# Patient Record
Sex: Male | Born: 2000 | Race: White | Hispanic: No | Marital: Single
Health system: Southern US, Community
[De-identification: ages and names within clinical notes are randomized; demographics above are authoritative.]

## PROBLEM LIST (undated history)

## (undated) DIAGNOSIS — G40802 Other epilepsy, not intractable, without status epilepticus: Secondary | ICD-10-CM

## (undated) DIAGNOSIS — E669 Obesity, unspecified: Secondary | ICD-10-CM

## (undated) DIAGNOSIS — H539 Unspecified visual disturbance: Secondary | ICD-10-CM

## (undated) DIAGNOSIS — G939 Disorder of brain, unspecified: Secondary | ICD-10-CM

## (undated) DIAGNOSIS — L988 Other specified disorders of the skin and subcutaneous tissue: Secondary | ICD-10-CM

---
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
## 2019-04-29 ENCOUNTER — Emergency Department (HOSPITAL_COMMUNITY)
Admission: EM | Admit: 2019-04-29 | Discharge: 2019-04-30 | Disposition: A | Discharge status: Home / Self Care | Payer: 59 | Attending: Emergency Medicine | Admitting: Emergency Medicine

## 2019-04-29 ENCOUNTER — Other Ambulatory Visit: Payer: Self-pay

## 2019-04-29 ENCOUNTER — Encounter (HOSPITAL_COMMUNITY): Payer: Self-pay

## 2019-04-29 DIAGNOSIS — Z7722 Contact with and (suspected) exposure to environmental tobacco smoke (acute) (chronic): Secondary | ICD-10-CM | POA: Diagnosis not present

## 2019-04-29 DIAGNOSIS — R569 Unspecified convulsions: Secondary | ICD-10-CM | POA: Insufficient documentation

## 2019-04-29 LAB — CBG MONITORING, ED: Glucose-Capillary: 108 mg/dL — ABNORMAL HIGH (ref 70–99)

## 2019-04-29 MED ORDER — LACTATED RINGERS IV BOLUS
1000.0000 mL | Freq: Once | INTRAVENOUS | Status: AC
  Administered 2019-04-29: 1000 mL via INTRAVENOUS

## 2019-04-29 MED ORDER — VALPROATE SODIUM 500 MG/5ML IV SOLN
500.0000 mg | Freq: Once | INTRAVENOUS | Status: AC
  Administered 2019-04-30: 01:00:00 500 mg via INTRAVENOUS
  Filled 2019-04-29: qty 5

## 2019-04-29 MED ORDER — LORAZEPAM 2 MG/ML IJ SOLN
2.0000 mg | INTRAMUSCULAR | Status: DC | PRN
  Administered 2019-04-29: 2 mg via INTRAVENOUS
  Filled 2019-04-29: qty 1

## 2019-04-29 NOTE — ED Triage Notes (Signed)
Pt brought in by the pt father. Father reports that the pt has been having seizures since 3pm. Father reports having at least 15 seizures today. Pt has a hxt of seizures.   Pt arrived aox4 and then had a seizure. Seizure lasted 20-25sec.

## 2019-04-29 NOTE — ED Provider Notes (Signed)
Emergency Department Provider Note   I have reviewed the triage vital signs and the nursing notes.   HISTORY  Chief Complaint Seizures   HPI Joe Thomas is a 19 y.o. male who presents the emerge department today with seizures.  Limited history secondary to father not being around but on review of the records and talking the patient is sounds like he has had more than 5 seizures today.  He is recently switched to a new medication called X CO PRI, has been titrating his other medications and that 1 accordingly.  Notes from [facility] neurology directing the same.  Patient denies any injuries or pain at this time.  He is very sleepy but denies any other changes in diet or health that could be contributing.  Father is not in the room at this time will obtain more history when he comes back. Father states that they did not actually reduce any dosing they just switched the lamotrigine to the morning because he was oversedated at night.  They increased his Depakote but that was just today that they recommended that.  He states compliance with medications and approximately 13-15 seizures throughout the day today as far as he can count.  Had already given him some intranasal benzodiazepine.  He states his seizures are typical for him with clenching of his arms and loss of consciousness lasting approximately 15 to 20 seconds.   No other associated or modifying symptoms.    Past Medical History:  Diagnosis Date  . Brain damage   . Frontal lobe epilepsy (HCC)    seizures are always at night, per father; last seizure 01/23/2017  . Obesity   . Pilonidal disease 01/2017  . Vision abnormalities    " eyes jump around "    Patient Active Problem List   Diagnosis Date Noted  . Pilonidal disease 01/29/2017  . 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 ADENOIDECTOMY  2003  . IMPLANTATION VAGAL NERVE STIMULATOR  08/20/2016  . PILONIDAL CYST EXCISION N/A  01/30/2017   Procedure: EXCISION PILONIDAL CYST WITH OPEN PACKING;  Surgeon: Darnell Level, MD;  Location: [facility] OR;  Service: General;  Laterality: N/A;  . TONSILLECTOMY  2003  . TYMPANOSTOMY TUBE PLACEMENT  2003    Current Outpatient Rx  . Order #: [id number] Class: Historical Med  . Order #: [id number] Class: Historical Med  . Order #: [id number] Class: Historical Med  . Order #: [id number] Class: Historical Med  . Order #: [id number] Class: Historical Med    Allergies Keppra [levetiracetam] and Penicillins  Family History  Problem Relation Age of Onset  . Multiple sclerosis Mother   . Diabetes Father   . COPD Maternal Grandmother   . Cancer Maternal Grandfather   . Stroke Paternal Grandmother   . Diabetes Paternal Grandfather   . Cancer Maternal Aunt     Social History Social History   Tobacco Use  . Smoking status: Passive Smoke Exposure - Never Smoker  . Smokeless tobacco: Never Used  . Tobacco comment: mother smokes inside  Substance Use Topics  . Alcohol use: No  . Drug use: No    Review of Systems  All other systems negative except as documented in the HPI. All pertinent positives and negatives as reviewed in the HPI. ____________________________________________   PHYSICAL EXAM:  VITAL SIGNS: ED Triage Vitals [04/29/19 2321]  Enc Vitals Group     BP 138/86     Pulse Rate (!) 120  Resp 20     Temp 99.6 F (37.6 C)     Temp Source Oral     SpO2 100 %    Constitutional: Alert and oriented. Well appearing and in no acute distress. Eyes: Conjunctivae are normal. PERRL. Bilateral horizontal nystagmus but EOMI otherwise. Head: Atraumatic. Nose: No congestion/rhinnorhea. Mouth/Throat: Mucous membranes are moist.  Oropharynx non-erythematous. Neck: No stridor.  No meningeal signs.   Cardiovascular: tachycardic rate, regular rhythm. Good peripheral circulation. Grossly normal heart sounds.   Respiratory: Normal respiratory effort.  No retractions. Lungs  CTAB. Gastrointestinal: Soft and nontender. No distention.  Musculoskeletal: No lower extremity tenderness nor edema. No gross deformities of extremities. Neurologic:  Normal speech and language. No gross focal neurologic deficits are appreciated.  Skin:  Skin is warm, dry and intact. No rash noted.   ____________________________________________   LABS (all labs ordered are listed, but only abnormal results are displayed)  Labs Reviewed  VALPROIC ACID LEVEL - Abnormal; Notable for the following components:      Result Value   Valproic Acid Lvl 29 (*)    All other components within normal limits  COMPREHENSIVE METABOLIC PANEL - Abnormal; Notable for the following components:   Glucose, Bld 109 (*)    Total Protein 9.0 (*)    All other components within normal limits  CBG MONITORING, ED - Abnormal; Notable for the following components:   Glucose-Capillary 108 (*)    All other components within normal limits  CBC WITH DIFFERENTIAL/PLATELET  LAMOTRIGINE LEVEL   ____________________________________________  EKG   EKG Interpretation  Date/Time:  Wednesday April 29 2019 23:19:46 EDT Ventricular Rate:  114 PR Interval:    QRS Duration: 89 QT Interval:  296 QTC Calculation: 408 R Axis:   87 Text Interpretation: Sinus tachycardia Abnormal Q suggests anterior infarct Repol abnrm suggests ischemia, inferior leads Borderline ST elevation, anterior leads mild inferior st changes compared to previous Confirmed by Merrily Pew [phone]) on 04/29/2019 11:29:56 PM       ____________________________________________  RADIOLOGY  No results found.  ____________________________________________   PROCEDURES  Procedure(s) performed:   Procedures   ____________________________________________   INITIAL IMPRESSION / ASSESSMENT AND PLAN / ED COURSE  Patient with multiple seizures today.  Will check lamotrigine and valproic acid levels.  Will load with Depakote, as needed Ativan  for repeat seizures, evaluate for any significant abnormalities on blood.  Will need to discuss with his neurologist at [facility] to see if they want to admit him. Discussed with Dr. Sabino Niemann at [facility] who is on-call for Dr. Brayton Caves recommends little bit longer observation in the ER and if still seizure-free and family comfortable then discharged home with close outpatient follow-up.  Depakote level low so family will observe him take his medications the next week.  No more seizures in the emergency department after Depakote load and benzodiazepines.  At this time patient will be discharged to call Dr. Brayton Caves for follow up. Recommendations discussed with father at bedside.   Pertinent labs & imaging results that were available during my care of the patient were reviewed by me and considered in my medical decision making (see chart for details).  A medical screening exam was performed and I feel the patient has had an appropriate workup for their chief complaint at this time and likelihood of emergent condition existing is low. They have been counseled on decision, discharge, follow up and which symptoms necessitate immediate return to the emergency department. They or their family verbally stated understanding and agreement  with plan and discharged in stable condition.   ____________________________________________  FINAL CLINICAL IMPRESSION(S) / ED DIAGNOSES  Final diagnoses:  Seizures (HCC)     MEDICATIONS GIVEN DURING THIS VISIT:  Medications  LORazepam (ATIVAN) injection 2 mg (2 mg Intravenous Given 04/29/19 2327)  lactated ringers bolus 1,000 mL (0 mLs Intravenous Stopped 04/30/19 0052)  valproate (DEPACON) 500 mg in dextrose 5 % 50 mL IVPB (0 mg Intravenous Stopped 04/30/19 0152)     NEW OUTPATIENT MEDICATIONS STARTED DURING THIS VISIT:  Current Discharge Medication List      Note:  This note was prepared with assistance of Dragon voice recognition software. Occasional wrong-word or  sound-a-like substitutions may have occurred due to the inherent limitations of voice recognition software.   Dmonte Maher, Barbara Cower, MD 04/30/19 [phone]

## 2019-04-30 DIAGNOSIS — R569 Unspecified convulsions: Secondary | ICD-10-CM | POA: Diagnosis not present

## 2019-04-30 LAB — VALPROIC ACID LEVEL: Valproic Acid Lvl: 29 ug/mL — ABNORMAL LOW (ref 50.0–100.0)

## 2019-04-30 LAB — COMPREHENSIVE METABOLIC PANEL
ALT: 32 U/L (ref 0–44)
AST: 24 U/L (ref 15–41)
Albumin: 4.5 g/dL (ref 3.5–5.0)
Alkaline Phosphatase: 58 U/L (ref 38–126)
Anion gap: 15 (ref 5–15)
BUN: 12 mg/dL (ref 6–20)
CO2: 25 mmol/L (ref 22–32)
Calcium: 10 mg/dL (ref 8.9–10.3)
Chloride: 102 mmol/L (ref 98–111)
Creatinine, Ser: 0.67 mg/dL (ref 0.61–1.24)
GFR calc Af Amer: >60 mL/min (ref >60)
GFR calc non Af Amer: >60 mL/min (ref >60)
Glucose, Bld: 109 mg/dL — ABNORMAL HIGH (ref 70–99)
Potassium: 4 mmol/L (ref 3.5–5.1)
Sodium: 142 mmol/L (ref 135–145)
Total Bilirubin: 0.3 mg/dL (ref 0.3–1.2)
Total Protein: 9 g/dL — ABNORMAL HIGH (ref 6.5–8.1)

## 2019-04-30 LAB — CBC WITH DIFFERENTIAL/PLATELET
Abs Immature Granulocytes: 0.02 10*3/uL (ref 0.00–0.07)
Basophils Absolute: 0 10*3/uL (ref 0.0–0.1)
Basophils Relative: 1 %
Eosinophils Absolute: 0.1 10*3/uL (ref 0.0–0.5)
Eosinophils Relative: 1 %
HCT: 46.9 % (ref 39.0–52.0)
Hemoglobin: 16 g/dL (ref 13.0–17.0)
Immature Granulocytes: 0 %
Lymphocytes Relative: 29 %
Lymphs Abs: 2.2 10*3/uL (ref 0.7–4.0)
MCH: 29 pg (ref 26.0–34.0)
MCHC: 34.1 g/dL (ref 30.0–36.0)
MCV: 85.1 fL (ref 80.0–100.0)
Monocytes Absolute: 0.9 10*3/uL (ref 0.1–1.0)
Monocytes Relative: 11 %
Neutro Abs: 4.5 10*3/uL (ref 1.7–7.7)
Neutrophils Relative %: 58 %
Platelets: 336 10*3/uL (ref 150–400)
RBC: 5.51 MIL/uL (ref 4.22–5.81)
RDW: 12.9 % (ref 11.5–15.5)
WBC: 7.7 10*3/uL (ref 4.0–10.5)
nRBC: 0 % (ref 0.0–0.2)

## 2019-04-30 NOTE — Discharge Instructions (Signed)
Please try to watch and ensure appropriate medication dosages until follow up with neurologist.

## 2019-04-30 NOTE — ED Notes (Signed)
Patient verbalizes understanding of discharge instructions. Opportunity for questioning and answers were provided. Armband removed by staff, pt discharged from ED via wheelchair.  

## 2019-05-01 LAB — LAMOTRIGINE LEVEL: Lamotrigine Lvl: 4.3 ug/mL (ref 2.0–20.0)

## 2019-08-31 ENCOUNTER — Other Ambulatory Visit: Payer: Self-pay

## 2019-08-31 ENCOUNTER — Encounter (HOSPITAL_COMMUNITY): Payer: Self-pay | Admitting: Emergency Medicine

## 2019-08-31 ENCOUNTER — Emergency Department (HOSPITAL_COMMUNITY)
Admission: EM | Admit: 2019-08-31 | Discharge: 2019-08-31 | Disposition: A | Discharge status: Home / Self Care | Payer: 59 | Attending: Emergency Medicine | Admitting: Emergency Medicine

## 2019-08-31 DIAGNOSIS — Z7722 Contact with and (suspected) exposure to environmental tobacco smoke (acute) (chronic): Secondary | ICD-10-CM | POA: Insufficient documentation

## 2019-08-31 DIAGNOSIS — R569 Unspecified convulsions: Secondary | ICD-10-CM | POA: Insufficient documentation

## 2019-08-31 LAB — CBC
HCT: 47.3 % (ref 39.0–52.0)
Hemoglobin: 15.6 g/dL (ref 13.0–17.0)
MCH: 28.5 pg (ref 26.0–34.0)
MCHC: 33 g/dL (ref 30.0–36.0)
MCV: 86.3 fL (ref 80.0–100.0)
Platelets: 344 10*3/uL (ref 150–400)
RBC: 5.48 MIL/uL (ref 4.22–5.81)
RDW: 12.5 % (ref 11.5–15.5)
WBC: 6.3 10*3/uL (ref 4.0–10.5)
nRBC: 0 % (ref 0.0–0.2)

## 2019-08-31 LAB — BASIC METABOLIC PANEL
Anion gap: 12 (ref 5–15)
BUN: 8 mg/dL (ref 6–20)
CO2: 26 mmol/L (ref 22–32)
Calcium: 9.1 mg/dL (ref 8.9–10.3)
Chloride: 106 mmol/L (ref 98–111)
Creatinine, Ser: 0.38 mg/dL — ABNORMAL LOW (ref 0.61–1.24)
GFR calc Af Amer: >60 mL/min (ref >60)
GFR calc non Af Amer: >60 mL/min (ref >60)
Glucose, Bld: 106 mg/dL — ABNORMAL HIGH (ref 70–99)
Potassium: 3.7 mmol/L (ref 3.5–5.1)
Sodium: 144 mmol/L (ref 135–145)

## 2019-08-31 LAB — VALPROIC ACID LEVEL: Valproic Acid Lvl: 65 ug/mL (ref 50.0–100.0)

## 2019-08-31 MED ORDER — LORAZEPAM 2 MG/ML IJ SOLN
2.0000 mg | Freq: Once | INTRAMUSCULAR | Status: AC
  Administered 2019-08-31: 2 mg via INTRAMUSCULAR
  Filled 2019-08-31: qty 1

## 2019-08-31 MED ORDER — LORAZEPAM 2 MG/ML IJ SOLN
2.0000 mg | Freq: Once | INTRAMUSCULAR | Status: AC
  Administered 2019-08-31: 2 mg via INTRAVENOUS
  Filled 2019-08-31: qty 1

## 2019-08-31 MED ORDER — SODIUM CHLORIDE 0.9 % IV BOLUS
1000.0000 mL | Freq: Once | INTRAVENOUS | Status: AC
  Administered 2019-08-31: 1000 mL via INTRAVENOUS

## 2019-08-31 NOTE — ED Notes (Signed)
meds given, will obs pt for shot time.

## 2019-08-31 NOTE — ED Provider Notes (Addendum)
[facility]-EMERGENCY DEPT Provider Note   CSN: [id number] Arrival date & time: 08/31/19  2010     History Chief Complaint  Patient presents with   Seizures    Joe Thomas is a 19 y.o. male.   Seizures Seizure activity on arrival: yes   Preceding symptoms: aura   Episode characteristics: abnormal movements, eye deviation, partial responsiveness and stiffening   Postictal symptoms: somnolence   Postictal symptoms: no confusion and no memory loss   Return to baseline: yes   Severity:  Moderate Timing:  Clustered Number of seizures this episode:  Multiple per day Progression:  Worsening Context comment:  Grandmother died and stress is a trigger Recent head injury:  No recent head injuries PTA treatment:  None History of seizures: yes        Past Medical History:  Diagnosis Date   Brain damage    Frontal lobe epilepsy (HCC)    seizures are always at night, per father; last seizure 01/23/2017   Obesity    Pilonidal disease 01/2017   Vision abnormalities    " eyes jump around "    Patient Active Problem List   Diagnosis Date Noted   Pilonidal disease 01/29/2017   Epilepsy (HCC) 11/15/2013    Past Surgical History:  Procedure Laterality Date   ADENOIDECTOMY  2003   IMPLANTATION VAGAL NERVE STIMULATOR  08/20/2016   PILONIDAL CYST EXCISION N/A 01/30/2017   Procedure: EXCISION PILONIDAL CYST WITH OPEN PACKING;  Surgeon: Darnell Level, MD;  Location: [facility] OR;  Service: General;  Laterality: N/A;   TONSILLECTOMY  2003   TYMPANOSTOMY TUBE PLACEMENT  2003       Family History  Problem Relation Age of Onset   Multiple sclerosis Mother    Diabetes Father    COPD Maternal Grandmother    Cancer Maternal Grandfather    Stroke Paternal Grandmother    Diabetes Paternal Grandfather    Cancer Maternal Aunt     Social History   Tobacco Use   Smoking status: Passive Smoke Exposure - Never Smoker   Smokeless tobacco:  Never Used   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 Authorizing Provider  Cenobamate (XCOPRI) 14 x 50 MG & 14 x100 MG TBPK Take 100 mg by mouth at bedtime.    [provider]  clonazePAM (KLONOPIN) 1 MG tablet Take 2 mg by mouth daily as needed (multiple seizures).    [provider]  divalproex (DEPAKOTE ER) 500 MG 24 hr tablet Take 2,000 mg by mouth at bedtime.    [provider]  lacosamide (VIMPAT) 200 MG TABS tablet Take 500 mg by mouth at bedtime.     [provider]  lamoTRIgine (LAMICTAL) 200 MG tablet Take 400 mg by mouth daily.     [provider]    Allergies    Keppra [levetiracetam] and Penicillins  Review of Systems   Review of Systems  Constitutional: Negative for chills and fever.  HENT: Negative for congestion and rhinorrhea.   Respiratory: Negative for cough and shortness of breath.   Cardiovascular: Negative for chest pain and palpitations.  Gastrointestinal: Negative for diarrhea, nausea and vomiting.  Genitourinary: Negative for difficulty urinating and dysuria.  Musculoskeletal: Negative for arthralgias and back pain.  Skin:  Negative for color change and rash.  Neurological: Positive for seizures. Negative for light-headedness and headaches.    Physical Exam Updated Vital Signs BP (!) 141/92 (BP Location: Left Arm)    Pulse 88    Temp 98.2 F (36.8 C) (Oral)    Resp 16    Ht 6\' 2"  (1.88 m)    Wt 122 kg    SpO2 100%    BMI 34.53 kg/m   Physical Exam Vitals and nursing note reviewed. Exam conducted with a chaperone present.  Constitutional:      General: He is not in acute distress.    Appearance: Normal appearance.  HENT:     Head: Normocephalic and atraumatic.     Nose: No rhinorrhea.  Eyes:     General:         Right eye: No discharge.        Left eye: No discharge.     Conjunctiva/sclera: Conjunctivae normal.  Cardiovascular:     Rate and Rhythm: Normal rate and regular rhythm.  Pulmonary:     Effort: Pulmonary effort is normal.     Breath sounds: No stridor.  Abdominal:     General: Abdomen is flat. There is no distension.     Palpations: Abdomen is soft.  Musculoskeletal:        General: No deformity or signs of injury.  Skin:    General: Skin is warm and dry.  Neurological:     General: No focal deficit present.     Mental Status: He is alert. Mental status is at baseline.     Motor: No weakness.     Comments: 5 out of 5 motor strength in all extremities, sensation intact throughout, no dysmetria, no dysdiadochokinesia, cranial nerves II through XII intact, alert and oriented to person place and time, did not ambulate patient due to frequency of seizures   Psychiatric:        Mood and Affect: Mood normal.        Behavior: Behavior normal.        Thought Content: Thought content normal.     ED Results / Procedures / Treatments   Labs (all labs ordered are listed, but only abnormal results are displayed) Labs Reviewed  BASIC METABOLIC PANEL - Abnormal; Notable for the following components:      Result Value   Glucose, Bld 106 (*)    Creatinine, Ser 0.38 (*)    All other components within normal limits  VALPROIC ACID LEVEL  CBC  LAMOTRIGINE LEVEL    EKG EKG Interpretation  Date/Time:  Monday August 31 2019 20:20:44 EDT Ventricular Rate:  105 PR Interval:    QRS Duration: 89 QT Interval:  312 QTC Calculation: 413 R Axis:   60 Text Interpretation: Sinus tachycardia Probable left atrial enlargement Borderline T abnormalities, inferior leads 12 Lead; Mason-Likar Confirmed by 10-15-2003 (Cherlynn Perches) on 08/31/2019 9:06:08 PM   Radiology No results found.  Procedures Procedures (including critical care time)  Medications Ordered in ED Medications  LORazepam (ATIVAN)  injection 2 mg (has no administration in time range)  sodium chloride 0.9 % bolus 1,000 mL (1,000 mLs Intravenous New Bag/Given 08/31/19 2040)  LORazepam (ATIVAN) injection 2 mg (2 mg Intravenous Given 08/31/19 2043)    ED Course  I have reviewed the triage vital signs and the nursing notes.  Pertinent labs & imaging results that were available during my care of the patient were reviewed by me and considered in my medical decision making (  see chart for details).    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  No recent illness or infection.  Vital signs stable no fevers.  Will get Ativan as I have witnessed 2 seizures that are very brief, no vital sign abnormalities.  The patient can feel them coming and let us know.  We will check Depakote lamotrigine levels basic labs and contact his pediatric neurology group [facility] peds neuro).  I reviewed the laboratory analyses, he was given a milligram of Ativan, labs are without significant findings, Depakote level is within normal limits.  Lamotrigine level is pending however when I spoke to the pediatric neurologist on call for his group they said it would likely be low as they are tapering it off.  They recommend 1 mg of Klonopin tonight 1 mg Klonopin tomorrow morning and to call their epileptologist for outpatient recommendations.  Ativan had to be given an initial onset, and the patient was here for almost 2 hours and required a second dose, however they feel comfortable with the second dose going home and starting the Klonopin and following up as needed.  He has no issues with hypoxia, his seizures are very short-lived, and he has his vagal nerve stimulator at home.  They will follow up tomorrow as recommended.  Strict return precautions given.  Final Clinical Impression(s) / ED  Diagnoses Final diagnoses:  Seizure [facility])    Rx / DC Orders ED Discharge Orders    None       Sabino Donovan, MD 08/31/19 2151    Sabino Donovan, MD 08/31/19 2210

## 2019-08-31 NOTE — ED Notes (Signed)
Pt states he feels as though he is getting and aura like he is going to have a seizure, reports tongue tingling. MD notified

## 2019-08-31 NOTE — Discharge Instructions (Addendum)
Take 1mg  klonipin tonight and 1mg  in the am and call your neurologist for further med recommendations

## 2019-08-31 NOTE — ED Triage Notes (Signed)
Patient reports increase in seizures since earlier today. States seizures are about 30 minutes apart and last approx 5-8 seconds. Patient had seizure in lobby which lasted approx 5-6 seconds. Patient states he does not know if he hit his head. AOx4.

## 2019-09-02 LAB — LAMOTRIGINE LEVEL: Lamotrigine Lvl: 1.2 ug/mL — ABNORMAL LOW (ref 2.0–20.0)

## 2020-01-16 ENCOUNTER — Other Ambulatory Visit: Payer: Self-pay

## 2020-01-16 ENCOUNTER — Emergency Department (HOSPITAL_COMMUNITY)
Admission: EM | Admit: 2020-01-16 | Discharge: 2020-01-17 | Disposition: A | Discharge status: Home / Self Care | Payer: 59 | Attending: Emergency Medicine | Admitting: Emergency Medicine

## 2020-01-16 ENCOUNTER — Emergency Department (HOSPITAL_COMMUNITY): Payer: 59

## 2020-01-16 DIAGNOSIS — Y92009 Unspecified place in unspecified non-institutional (private) residence as the place of occurrence of the external cause: Secondary | ICD-10-CM | POA: Diagnosis not present

## 2020-01-16 DIAGNOSIS — W01198A Fall on same level from slipping, tripping and stumbling with subsequent striking against other object, initial encounter: Secondary | ICD-10-CM | POA: Insufficient documentation

## 2020-01-16 DIAGNOSIS — Z23 Encounter for immunization: Secondary | ICD-10-CM | POA: Diagnosis not present

## 2020-01-16 DIAGNOSIS — Z7722 Contact with and (suspected) exposure to environmental tobacco smoke (acute) (chronic): Secondary | ICD-10-CM | POA: Diagnosis not present

## 2020-01-16 DIAGNOSIS — R569 Unspecified convulsions: Secondary | ICD-10-CM | POA: Diagnosis not present

## 2020-01-16 DIAGNOSIS — T148XXA Other injury of unspecified body region, initial encounter: Secondary | ICD-10-CM

## 2020-01-16 DIAGNOSIS — S0081XA Abrasion of other part of head, initial encounter: Secondary | ICD-10-CM | POA: Insufficient documentation

## 2020-01-16 LAB — CBG MONITORING, ED: Glucose-Capillary: 68 mg/dL — ABNORMAL LOW (ref 70–99)

## 2020-01-16 MED ORDER — TETANUS-DIPHTH-ACELL PERTUSSIS 5-2.5-18.5 LF-MCG/0.5 IM SUSY
0.5000 mL | PREFILLED_SYRINGE | Freq: Once | INTRAMUSCULAR | Status: AC
  Administered 2020-01-16: 0.5 mL via INTRAMUSCULAR
  Filled 2020-01-16: qty 0.5

## 2020-01-16 NOTE — ED Provider Notes (Signed)
[facility]-EMERGENCY DEPT Provider Note   CSN: [id number] Arrival date & time: 01/16/20  1749     History Chief Complaint  Patient presents with  . Seizures  . Head Laceration    Joe Thomas is a 19 y.o. male who presents for evaluation of head injury and seizure. Patient with known seizure disorders, currently followed by [facility]. He recently had a deep brain stimulator inserted. He reports that today he had a seizure, causing him to fall and hit his head. He thinks he hit it on the wall and then on the floor.  He does not think he had any LOC.  He does report that he is on his medications and states he has been compliant with it.  He does have seizures daily.  He states he has noted that he has had about 4-5 today.  He usually gets about 3 a day.  He is supposed to follow-up with his neurologist in 2 days.  He has not had any new medications or changes in his dosage.  He denies any recent sickness, fevers.  He denies any vision changes, numbness/weakness of arms or legs.  The history is provided by the patient.       Past Medical History:  Diagnosis Date  . Brain damage   . Frontal lobe epilepsy (HCC)    seizures are always at night, per father; last seizure 01/23/2017  . Obesity   . Pilonidal disease 01/2017  . Vision abnormalities    " eyes jump around "    Patient Active Problem List   Diagnosis Date Noted  . Pilonidal disease 01/29/2017  . 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 ADENOIDECTOMY  2003  . IMPLANTATION VAGAL NERVE STIMULATOR  08/20/2016  . PILONIDAL CYST EXCISION N/A 01/30/2017   Procedure: EXCISION PILONIDAL CYST WITH OPEN PACKING;  Surgeon: Darnell Level, MD;  Location: [facility] OR;  Service: General;  Laterality: N/A;  . TONSILLECTOMY  2003  . TYMPANOSTOMY TUBE PLACEMENT  2003       Family History  Problem Relation Age of Onset  . Multiple sclerosis Mother   . Diabetes Father    . COPD Maternal Grandmother   . Cancer Maternal Grandfather   . Stroke Paternal Grandmother   . Diabetes Paternal Grandfather   . Cancer Maternal Aunt     Social History   Tobacco Use  . Smoking status: Passive Smoke Exposure - Never Smoker  . Smokeless tobacco: Never Used  . Tobacco comment: mother smokes inside  Vaping Use  . Vaping Use: Never used  Substance Use Topics  . Alcohol use: No  . Drug use: No    Home Medications Prior to Admission medications   Medication Sig Start Date End Date Taking? Authorizing Provider  Cenobamate (XCOPRI) 14 x 50 MG & 14 x100 MG TBPK Take 100 mg by mouth at bedtime.    [provider]  clonazePAM (KLONOPIN) 1 MG tablet Take 2 mg by mouth daily as needed (multiple seizures).    [provider]  divalproex (DEPAKOTE ER) 500 MG 24 hr tablet Take 2,000 mg by mouth at bedtime.    [provider]  lacosamide (VIMPAT) 200 MG TABS tablet Take 500 mg by mouth at bedtime.     [provider]  lamoTRIgine (LAMICTAL) 200 MG tablet Take 400 mg by mouth daily.     [provider]    Allergies  Keppra [levetiracetam] and Penicillins  Review of Systems   Review of Systems  Constitutional: Negative for fever.  Eyes: Negative for visual disturbance.  Musculoskeletal: Negative for neck pain.  Neurological: Positive for seizures and headaches. Negative for weakness and numbness.  All other systems reviewed and are negative.   Physical Exam Updated Vital Signs BP (!) 144/93 (BP Location: Right Arm)   Pulse 78   Temp 98.5 F (36.9 C) (Oral)   Resp 15   Ht 6\' 2"  (1.88 m)   Wt 118.8 kg   SpO2 97%   BMI 33.64 kg/m   Physical Exam Vitals and nursing note reviewed.  Constitutional:      Appearance: Normal appearance. He is well-developed.  HENT:     Head: Normocephalic and atraumatic.      Comments: Staples in place noted to the left temporal area.  No dehiscence.  No surrounding warmth, erythema.   3 cm linear abrasion noted to the forehead.  No deep laceration, open wound. Eyes:     General: Lids are normal.     Conjunctiva/sclera: Conjunctivae normal.     Pupils: Pupils are equal, round, and reactive to light.  Neck:     Comments: Full flexion/extension and lateral movement of neck fully intact. No bony midline tenderness. No deformities or crepitus.  Cardiovascular:     Rate and Rhythm: Normal rate and regular rhythm.     Pulses: Normal pulses.     Heart sounds: Normal heart sounds. No murmur heard.  No friction rub. No gallop.   Pulmonary:     Effort: Pulmonary effort is normal.     Breath sounds: Normal breath sounds.  Musculoskeletal:        General: Normal range of motion.     Cervical back: Full passive range of motion without pain.  Skin:    General: Skin is warm and dry.     Capillary Refill: Capillary refill takes less than 2 seconds.  Neurological:     Mental Status: He is alert and oriented to person, place, and time.     Comments: Cranial nerves III-XII intact Follows commands, Moves all extremities  5/5 strength to BUE and BLE  Sensation intact throughout all major nerve distributions No slurred speech. No facial droop.   Psychiatric:        Speech: Speech normal.     ED Results / Procedures / Treatments   Labs (all labs ordered are listed, but only abnormal results are displayed) Labs Reviewed  CBG MONITORING, ED - Abnormal; Notable for the following components:      Result Value   Glucose-Capillary 68 (*)    All other components within normal limits    EKG None  Radiology CT Head Wo Contrast  Result Date: 01/17/2020 CLINICAL DATA:  Head trauma, seizure EXAM: CT HEAD WITHOUT CONTRAST TECHNIQUE: Contiguous axial images were obtained from the base of the skull through the vertex without intravenous contrast. COMPARISON:  None. FINDINGS: Brain: Deep brain stimulator leads are noted. No hemorrhage along the path. There is mild edema within both  frontal lobes along the course of the leads. No midline shift or other mass effect. Vascular: No hyperdense vessel or unexpected calcification. Skull: Bifrontal burr holes. Sinuses/Orbits: No acute finding. Other: None. IMPRESSION: 1. Mild edema within both frontal lobes along the course of the deep brain stimulator leads. No hemorrhage along the path. 2. No midline shift or other mass effect. Electronically Signed   By: 14/06/2019.D.  On: 01/17/2020 00:06    Procedures Procedures (including critical care time)  Medications Ordered in ED Medications  Tdap (BOOSTRIX) injection 0.5 mL (0.5 mLs Intramuscular Given 01/16/20 2326)    ED Course  I have reviewed the triage vital signs and the nursing notes.  Pertinent labs & imaging results that were available during my care of the patient were reviewed by me and considered in my medical decision making (see chart for details).    MDM Rules/Calculators/A&P                          19 year old male possible history of seizure disorder who presents for evaluation of seizure.  Reports he did have one today and states that he fell and hit his head on the wall or the floor.  He thinks he may have LOC.  He does have history of seizures and has been compliant with his medications.  He recently just had a deep brain stimulator.  He reports that he has had daily seizures which is normal for him.  He has had a few more recently.  He states he has had about 4-5 seizures today.  On initial arrival, he is afebrile, nontoxic-appearing.  Vital signs are stable.  He has a linear abrasion noted to the forehead.  No skull deformity or crepitus noted.  He has staples intact noted where he had the brain stimulator in.  They appear intact.  No neuro deficits noted on exam.  Given that he hit his head and has some tenderness as well as his recent deep brain stimulator, we will plan for CT imaging.  At this time, this is consistent with patient's seizure disorders.  No  no indication for breakthrough seizure work-up. Discussed patient with Dr. Rubin Payor who is agreeable.   Wound care provided.  This appears to be a superficial abrasion.  No deep laceration that would require stitches.  Head CT shows mild edema within both frontal lobes along the course of the deep brain stimulator leads.  No hemorrhage.  No midline shift or other mass-effect.  Discussed results with patient and dad.  Instructed patient to follow-up with his neurologist.  Dad and patient are agreeable to plan. At this time, patient exhibits no emergent life-threatening condition that require further evaluation in ED. Patient had ample opportunity for questions and discussion. All patient's questions were answered with full understanding. Strict return precautions discussed. Patient expresses understanding and agreement to plan.    Portions of this note were generated with Scientist, clinical (histocompatibility and immunogenetics). Dictation errors may occur despite best attempts at proofreading.   Final Clinical Impression(s) / ED Diagnoses Final diagnoses:  Abrasion  Seizure [facility])    Rx / DC Orders ED Discharge Orders    None       Rosana Hoes 01/17/20 0018    Benjiman Core, MD 01/18/20 [phone]

## 2020-01-16 NOTE — ED Notes (Signed)
CBG 68 

## 2020-01-16 NOTE — ED Triage Notes (Signed)
Pt POV with father-  Pt AOx4 at time of triage.   Pt reports being at home when he felt seizure (appx 1.5 hr PTA) come on, reports falling forward onto floor, hitting incision.  Pt reports having deep brain stimulator recently implanted 4 weeks ago, turned on this past Tuesday.  Father reports 6-8 seizures per day x3 days. Pt reports compliance with seizure medications, reports no longer taking Lamictal.    No oral trauma noted.

## 2020-01-16 NOTE — Discharge Instructions (Addendum)
Keep the wound clean and dry.  You can apply Neosporin or bacitracin.  Make sure you are taking your seizure medications as directed.  Follow-up with your neurologist.  Call their office as soon as possible to arrange for an appointment.

## 2020-01-21 ENCOUNTER — Other Ambulatory Visit: Payer: Self-pay

## 2020-01-21 ENCOUNTER — Encounter (HOSPITAL_COMMUNITY): Payer: Self-pay | Admitting: Emergency Medicine

## 2020-01-21 ENCOUNTER — Inpatient Hospital Stay (HOSPITAL_COMMUNITY)
Admission: EM | Admit: 2020-01-21 | Discharge: 2020-01-26 | DRG: 175 | Disposition: A | Discharge status: Home / Self Care | Payer: 59 | Attending: Internal Medicine | Admitting: Internal Medicine

## 2020-01-21 ENCOUNTER — Emergency Department (HOSPITAL_COMMUNITY): Payer: 59

## 2020-01-21 DIAGNOSIS — I2693 Single subsegmental pulmonary embolism without acute cor pulmonale: Secondary | ICD-10-CM | POA: Diagnosis not present

## 2020-01-21 DIAGNOSIS — R0789 Other chest pain: Secondary | ICD-10-CM | POA: Diagnosis not present

## 2020-01-21 DIAGNOSIS — G40909 Epilepsy, unspecified, not intractable, without status epilepticus: Secondary | ICD-10-CM

## 2020-01-21 DIAGNOSIS — Z20822 Contact with and (suspected) exposure to covid-19: Secondary | ICD-10-CM | POA: Diagnosis present

## 2020-01-21 DIAGNOSIS — D649 Anemia, unspecified: Secondary | ICD-10-CM | POA: Diagnosis present

## 2020-01-21 DIAGNOSIS — I2699 Other pulmonary embolism without acute cor pulmonale: Secondary | ICD-10-CM

## 2020-01-21 DIAGNOSIS — Z6835 Body mass index (BMI) 35.0-35.9, adult: Secondary | ICD-10-CM

## 2020-01-21 DIAGNOSIS — E669 Obesity, unspecified: Secondary | ICD-10-CM | POA: Diagnosis present

## 2020-01-21 DIAGNOSIS — Z79899 Other long term (current) drug therapy: Secondary | ICD-10-CM

## 2020-01-21 DIAGNOSIS — Z888 Allergy status to other drugs, medicaments and biological substances status: Secondary | ICD-10-CM

## 2020-01-21 DIAGNOSIS — Z880 Allergy status to penicillin: Secondary | ICD-10-CM

## 2020-01-21 DIAGNOSIS — J9601 Acute respiratory failure with hypoxia: Secondary | ICD-10-CM | POA: Diagnosis present

## 2020-01-21 DIAGNOSIS — J189 Pneumonia, unspecified organism: Secondary | ICD-10-CM | POA: Diagnosis present

## 2020-01-21 DIAGNOSIS — G9341 Metabolic encephalopathy: Secondary | ICD-10-CM | POA: Diagnosis present

## 2020-01-21 DIAGNOSIS — Z7722 Contact with and (suspected) exposure to environmental tobacco smoke (acute) (chronic): Secondary | ICD-10-CM | POA: Diagnosis present

## 2020-01-21 DIAGNOSIS — R059 Cough, unspecified: Secondary | ICD-10-CM

## 2020-01-21 LAB — PROTIME-INR
INR: 0.9 (ref 0.8–1.2)
Prothrombin Time: 12.1 seconds (ref 11.4–15.2)

## 2020-01-21 LAB — CBC
HCT: 40.8 % (ref 39.0–52.0)
Hemoglobin: 13.8 g/dL (ref 13.0–17.0)
MCH: 28.8 pg (ref 26.0–34.0)
MCHC: 33.8 g/dL (ref 30.0–36.0)
MCV: 85.2 fL (ref 80.0–100.0)
Platelets: 308 10*3/uL (ref 150–400)
RBC: 4.79 MIL/uL (ref 4.22–5.81)
RDW: 12.1 % (ref 11.5–15.5)
WBC: 8.9 10*3/uL (ref 4.0–10.5)
nRBC: 0 % (ref 0.0–0.2)

## 2020-01-21 LAB — BASIC METABOLIC PANEL
Anion gap: 13 (ref 5–15)
BUN: 12 mg/dL (ref 6–20)
CO2: 26 mmol/L (ref 22–32)
Calcium: 9.4 mg/dL (ref 8.9–10.3)
Chloride: 101 mmol/L (ref 98–111)
Creatinine, Ser: 0.61 mg/dL (ref 0.61–1.24)
GFR, Estimated: >60 mL/min (ref >60)
Glucose, Bld: 110 mg/dL — ABNORMAL HIGH (ref 70–99)
Potassium: 3.5 mmol/L (ref 3.5–5.1)
Sodium: 140 mmol/L (ref 135–145)

## 2020-01-21 LAB — TROPONIN I (HIGH SENSITIVITY): Troponin I (High Sensitivity): 3 ng/L (ref <18)

## 2020-01-21 MED ORDER — ACETAMINOPHEN 500 MG PO TABS
1000.0000 mg | ORAL_TABLET | Freq: Once | ORAL | Status: AC
  Administered 2020-01-22: 1000 mg via ORAL
  Filled 2020-01-21: qty 2

## 2020-01-21 MED ORDER — OXYCODONE HCL 5 MG PO TABS
5.0000 mg | ORAL_TABLET | Freq: Once | ORAL | Status: AC
  Administered 2020-01-22: 5 mg via ORAL
  Filled 2020-01-21: qty 1

## 2020-01-21 NOTE — ED Triage Notes (Signed)
Patient reports intermittent right chest pain with mild SOB and nausea onset yesterday afternoon , no cough or fever . Pain increases with deep inspiration .

## 2020-01-21 NOTE — ED Provider Notes (Signed)
[facility] EMERGENCY DEPARTMENT Provider Note   CSN: [id number] Arrival date & time: 01/21/20  2112     History Chief Complaint  Patient presents with  . Chest Pain    Joe Thomas is a 19 y.o. male.  19 yo M with a chief complaints of right-sided chest pain.  This is described as sharp going on since yesterday afternoon.  Worse with deep breathing movement palpation and certain positions.  Denies cough congestion or fever.  Denies abdominal pain.  Denies nausea or vomiting.  Pain had worsened throughout the day yesterday.  Radiates into his back.  Patient denies history of MI denies hyperlipidemia hypertension diabetes smoking or family history of MI.  Patient denies history of PE or DVT denies hemoptysis denies unilateral lower extremity edema.  States his mother and maternal grandmother both had blood clots in their legs and lungs.  He is 2 weeks post having a stimulator placed to prevent seizures.  Is having pain overlying the device.  No significant pain immediately after having the device placed.  No redness no drainage again reiterated no fever.  The history is provided by the patient.  Chest Pain Pain location:  R chest Pain quality: sharp and shooting   Pain radiates to:  Does not radiate Pain severity:  Moderate Onset quality:  Gradual Duration:  2 days Timing:  Constant Progression:  Worsening Chronicity:  New Relieved by:  Certain positions and rest Worsened by:  Deep breathing, certain positions, coughing and movement Ineffective treatments:  None tried Associated symptoms: shortness of breath   Associated symptoms: no abdominal pain, no fever, no headache, no palpitations and no vomiting        Past Medical History:  Diagnosis Date  . Brain damage   . Frontal lobe epilepsy (HCC)    seizures are always at night, per father; last seizure 01/23/2017  . Obesity   . Pilonidal disease 01/2017  . Vision abnormalities    " eyes jump  around "    Patient Active Problem List   Diagnosis Date Noted  . Pilonidal disease 01/29/2017  . 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 ADENOIDECTOMY  2003  . IMPLANTATION VAGAL NERVE STIMULATOR  08/20/2016  . PILONIDAL CYST EXCISION N/A 01/30/2017   Procedure: EXCISION PILONIDAL CYST WITH OPEN PACKING;  Surgeon: Darnell Level, MD;  Location: [facility] OR;  Service: General;  Laterality: N/A;  . TONSILLECTOMY  2003  . TYMPANOSTOMY TUBE PLACEMENT  2003       Family History  Problem Relation Age of Onset  . Multiple sclerosis Mother   . Diabetes Father   . COPD Maternal Grandmother   . Cancer Maternal Grandfather   . Stroke Paternal Grandmother   . Diabetes Paternal Grandfather   . Cancer Maternal Aunt     Social History   Tobacco Use  . Smoking status: Passive Smoke Exposure - Never Smoker  . Smokeless tobacco: Never Used  . Tobacco comment: mother smokes inside  Vaping Use  . Vaping Use: Never used  Substance Use Topics  . Alcohol use: No  . Drug use: No    Home Medications Prior to Admission medications   Medication Sig Start Date End Date Taking? Authorizing Provider  Cenobamate 200 MG TABS Take 200 mg by mouth at bedtime. 01/14/20  Yes [provider]  clonazePAM (KLONOPIN) 1 MG tablet Take 2 mg by mouth daily as needed for anxiety (multiple seizures).  Yes [provider]  divalproex (DEPAKOTE ER) 500 MG 24 hr tablet Take 3,000 mg by mouth at bedtime.   Yes [provider]  lacosamide (VIMPAT) 200 MG TABS tablet Take 500 mg by mouth at bedtime.    Yes [provider]  LamoTRIgine (LAMICTAL XR) 25 MG TB24 24 hour tablet Take 50 mg by mouth at bedtime. 01/14/20  Yes [provider]  Midazolam (NAYZILAM) 5 MG/0.1ML SOLN Place 1 each into the nose See admin instructions. Use 1 spray nasally as needed for seizures that presist 5 mins after trying clonazepam 09/04/19  Yes [provider]  Multiple Vitamins-Minerals (ONE-A-DAY FOR HIM VITACRAVES PO) Take 1 tablet by mouth daily.   Yes [provider]  Cenobamate (XCOPRI) 14 x 50 MG & 14 x100 MG TBPK Take 100 mg by mouth at bedtime. Patient not taking: No sig reported    [provider]    Allergies    Keppra [levetiracetam] and Penicillins  Review of Systems   Review of Systems  Constitutional: Negative for chills and fever.  HENT: Negative for congestion and facial swelling.   Eyes: Negative for discharge and visual disturbance.  Respiratory: Positive for shortness of breath.   Cardiovascular: Positive for chest pain. Negative for palpitations.  Gastrointestinal: Negative for abdominal pain, diarrhea and vomiting.  Musculoskeletal: Negative for arthralgias and myalgias.  Skin: Negative for color change and rash.  Neurological: Negative for tremors, syncope and headaches.  Psychiatric/Behavioral: Negative for confusion and dysphoric mood.    Physical Exam Updated Vital Signs BP 116/85   Pulse (!) 107   Temp 98.8 F (37.1 C) (Oral)   Resp 17   Ht 6\' 2"  (1.88 m)   Wt 125 kg   SpO2 94%   BMI 35.38 kg/m   Physical Exam Vitals and nursing note reviewed.  Constitutional:      Appearance: He is well-developed and well-nourished.  HENT:     Head: Normocephalic and atraumatic.  Eyes:     Extraocular Movements: EOM normal.     Pupils: Pupils are equal, round, and reactive to light.  Neck:     Vascular: No JVD.  Cardiovascular:     Rate and Rhythm: Regular rhythm. Tachycardia present.     Heart sounds: No murmur heard. No friction rub. No gallop.   Pulmonary:     Effort: No respiratory distress.     Breath sounds: No wheezing.  Chest:     Chest wall: Tenderness present.     Comments: Tenderness to the right chest wall overlying the device placed in his right chest.  There is no fluctuance no erythema no induration no drainage. Abdominal:     General: There is no  distension.     Tenderness: There is no guarding or rebound.  Musculoskeletal:        General: Normal range of motion.     Cervical back: Normal range of motion and neck supple.  Skin:    Coloration: Skin is not pale.     Findings: No rash.  Neurological:     Mental Status: He is alert and oriented to person, place, and time.  Psychiatric:        Mood and Affect: Mood and affect normal.        Behavior: Behavior normal.     ED Results / Procedures / Treatments   Labs (all labs ordered are listed, but only abnormal results are displayed) Labs Reviewed  BASIC METABOLIC PANEL - Abnormal; Notable  for the following components:      Result Value   Glucose, Bld 110 (*)    All other components within normal limits  D-DIMER, QUANTITATIVE (NOT AT [facility]) - Abnormal; Notable for the following components:   D-Dimer, Quant 0.89 (*)    All other components within normal limits  RESP PANEL BY RT-PCR (FLU A&B, COVID) ARPGX2  CBC  PROTIME-INR  HEPARIN LEVEL (UNFRACTIONATED)  CARDIOLIPIN ANTIBODIES, IGG, IGM, IGA  LUPUS ANTICOAGULANT PANEL  TROPONIN I (HIGH SENSITIVITY)  TROPONIN I (HIGH SENSITIVITY)    EKG EKG Interpretation  Date/Time:  Thursday January 21 2020 21:19:05 EST Ventricular Rate:  115 PR Interval:  138 QRS Duration: 86 QT Interval:  308 QTC Calculation: 426 R Axis:   78 Text Interpretation: Sinus tachycardia Septal infarct , age undetermined ST & T wave abnormality, consider inferior ischemia Abnormal ECG No significant change since last tracing Confirmed by Melene Plan [id number]) on 01/21/2020 11:22:22 PM   Radiology DG Chest 2 View  Result Date: 01/21/2020 CLINICAL DATA:  Chest pain EXAM: CHEST - 2 VIEW COMPARISON:  None. FINDINGS: Stimulator generator over the lower chest. No focal opacity or pleural effusion. Normal cardiomediastinal silhouette. No pneumothorax. IMPRESSION: No active cardiopulmonary disease. Electronically Signed   By: Jasmine Pang M.D.   On:  01/21/2020 21:43   CT Angio Chest PE W and/or Wo Contrast  Result Date: 01/22/2020 CLINICAL DATA:  Shortness of breath EXAM: CT ANGIOGRAPHY CHEST WITH CONTRAST TECHNIQUE: Multidetector CT imaging of the chest was performed using the standard protocol during bolus administration of intravenous contrast. Multiplanar CT image reconstructions and MIPs were obtained to evaluate the vascular anatomy. CONTRAST:  OMNIPAQUE IOHEXOL 350 MG/ML SOLN COMPARISON:  None. FINDINGS: Cardiovascular: There is a optimal opacification of the pulmonary arteries. No central filling defects are seen. There is nearly occlusive thrombus in the posterior right lower lobe segmental and subsegmental arterial branches. The heart is normal in size. No pericardial effusion or thickening. No evidence right heart strain. There is normal three-vessel brachiocephalic anatomy without proximal stenosis. The thoracic aorta is normal in appearance. Mediastinum/Nodes: No hilar, mediastinal, or axillary adenopathy. Thyroid gland, trachea, and esophagus demonstrate no significant findings. Lungs/Pleura: Rounded patchy airspace opacity seen at the posterior right lung base. No pleural effusion or pneumothorax is seen. Upper Abdomen: No acute abnormalities present in the visualized portions of the upper abdomen. Musculoskeletal: No chest wall abnormality. No acute or significant osseous findings. Review of the MIP images confirms the above findings. IMPRESSION: Nearly occlusive thrombus in the posterior right lower lobe segmental and subsegmental arterial branches. There is adjacent airspace opacities which could be due to atelectasis or pulmonary infarction. No evidence of right ventricular heart strain. Electronically Signed   By: Jonna Clark M.D.   On: 01/22/2020 03:02    Procedures Procedures (including critical care time)  Medications Ordered in ED Medications  apixaban (ELIQUIS) tablet 10 mg (has no administration in time range)     Followed by  apixaban (ELIQUIS) tablet 5 mg (has no administration in time range)  acetaminophen (TYLENOL) tablet 1,000 mg (1,000 mg Oral Given 01/22/20 0021)  oxyCODONE (Oxy IR/ROXICODONE) immediate release tablet 5 mg (5 mg Oral Given 01/22/20 0022)  morphine 4 MG/ML injection 4 mg (4 mg Intravenous Given 01/22/20 0154)  ondansetron (ZOFRAN) injection 4 mg (4 mg Intravenous Given 01/22/20 0154)  iohexol (OMNIPAQUE) 350 MG/ML injection 100 mL (100 mLs Intravenous Contrast Given 01/22/20 0237)    ED Course  I have reviewed the  triage vital signs and the nursing notes.  Pertinent labs & imaging results that were available during my care of the patient were reviewed by me and considered in my medical decision making (see chart for details).    MDM Rules/Calculators/A&P                          19 yo M with a chief complaint of chest pain.  This is right-sided and sharp.  Worse with movement and palpation.  Is likely this is musculoskeletal though it is overlying the recent site of a stimulator that was placed to prevent seizures.  Seems less likely to be infected based on the timeline, has now been 2 weeks out and started having pain yesterday.  He is tachycardic into the 110s in triage.  Tachycardic persistently in the room with me.  Will obtain a D-dimer.  Second Theodis Aguas is negative.  Patient's D-dimer is elevated.  Will obtain a CT angiogram of the chest.  On reassessment the patient having no significant improvement with Tylenol and oxycodone.  We will give a dose of morphine IV.  Patient's pain is mildly better on reassessment.  Heart rate has improved.  Patient is still fairly tachypneic with a respiratory rate in the 30s.  Oxygen is now dipping into the upper 80s.    CT angiogram of the chest is concerning for a pulmonary embolism with likely pulmonary infarct.  Will discuss with medicine for admission.  CRITICAL CARE Performed by: Rae Roam   Total critical care time:  35 minutes  Critical care time was exclusive of separately billable procedures and treating other patients.  Critical care was necessary to treat or prevent imminent or life-threatening deterioration.  Critical care was time spent personally by me on the following activities: development of treatment plan with patient and/or surrogate as well as nursing, discussions with consultants, evaluation of patient's response to treatment, examination of patient, obtaining history from patient or surrogate, ordering and performing treatments and interventions, ordering and review of laboratory studies, ordering and review of radiographic studies, pulse oximetry and re-evaluation of patient's condition.  The patients results and plan were reviewed and discussed.   Any x-rays performed were independently reviewed by myself.   Differential diagnosis were considered with the presenting HPI.  Medications  apixaban (ELIQUIS) tablet 10 mg (has no administration in time range)    Followed by  apixaban (ELIQUIS) tablet 5 mg (has no administration in time range)  acetaminophen (TYLENOL) tablet 1,000 mg (1,000 mg Oral Given 01/22/20 0021)  oxyCODONE (Oxy IR/ROXICODONE) immediate release tablet 5 mg (5 mg Oral Given 01/22/20 0022)  morphine 4 MG/ML injection 4 mg (4 mg Intravenous Given 01/22/20 0154)  ondansetron (ZOFRAN) injection 4 mg (4 mg Intravenous Given 01/22/20 0154)  iohexol (OMNIPAQUE) 350 MG/ML injection 100 mL (100 mLs Intravenous Contrast Given 01/22/20 0237)    Vitals:   01/22/20 0115 01/22/20 0130 01/22/20 0145 01/22/20 0155  BP: 134/81 135/79 116/85   Pulse: (!) 127 (!) 119 (!) 107   Resp: 17 (!) 21 17   Temp:    98.8 F (37.1 C)  TempSrc:    Oral  SpO2: 93% 94% 94%   Weight:      Height:        Final diagnoses:  Single subsegmental pulmonary embolism without acute cor pulmonale (HCC)    Admission/ observation were discussed with the admitting physician, patient and/or family and  they are comfortable with  the plan.    Final Clinical Impression(s) / ED Diagnoses Final diagnoses:  Single subsegmental pulmonary embolism without acute cor pulmonale [facility](HCC)    Rx / DC Orders ED Discharge Orders    None       Melene PlanFloyd, Hadlee Burback, DO 01/22/20 [phone]

## 2020-01-21 NOTE — ED Notes (Signed)
Patient came up to this NT to state "I feel like I'm going to pass out." Vitals obtained. Triage RN Reita Cliche notified.

## 2020-01-22 ENCOUNTER — Encounter (HOSPITAL_COMMUNITY): Payer: Self-pay | Admitting: Family Medicine

## 2020-01-22 ENCOUNTER — Observation Stay (HOSPITAL_BASED_OUTPATIENT_CLINIC_OR_DEPARTMENT_OTHER): Payer: 59

## 2020-01-22 ENCOUNTER — Emergency Department (HOSPITAL_COMMUNITY): Payer: 59

## 2020-01-22 ENCOUNTER — Observation Stay (HOSPITAL_COMMUNITY): Payer: 59

## 2020-01-22 DIAGNOSIS — D649 Anemia, unspecified: Secondary | ICD-10-CM | POA: Diagnosis present

## 2020-01-22 DIAGNOSIS — R509 Fever, unspecified: Secondary | ICD-10-CM | POA: Diagnosis not present

## 2020-01-22 DIAGNOSIS — I2699 Other pulmonary embolism without acute cor pulmonale: Secondary | ICD-10-CM

## 2020-01-22 DIAGNOSIS — Z6835 Body mass index (BMI) 35.0-35.9, adult: Secondary | ICD-10-CM | POA: Diagnosis not present

## 2020-01-22 DIAGNOSIS — R0602 Shortness of breath: Secondary | ICD-10-CM | POA: Diagnosis not present

## 2020-01-22 DIAGNOSIS — G40909 Epilepsy, unspecified, not intractable, without status epilepticus: Secondary | ICD-10-CM | POA: Diagnosis not present

## 2020-01-22 DIAGNOSIS — R0789 Other chest pain: Secondary | ICD-10-CM | POA: Diagnosis present

## 2020-01-22 DIAGNOSIS — M7989 Other specified soft tissue disorders: Secondary | ICD-10-CM | POA: Diagnosis not present

## 2020-01-22 DIAGNOSIS — J189 Pneumonia, unspecified organism: Secondary | ICD-10-CM | POA: Diagnosis not present

## 2020-01-22 DIAGNOSIS — I2693 Single subsegmental pulmonary embolism without acute cor pulmonale: Secondary | ICD-10-CM | POA: Diagnosis not present

## 2020-01-22 DIAGNOSIS — E669 Obesity, unspecified: Secondary | ICD-10-CM | POA: Diagnosis present

## 2020-01-22 DIAGNOSIS — G9341 Metabolic encephalopathy: Secondary | ICD-10-CM | POA: Diagnosis present

## 2020-01-22 DIAGNOSIS — Z79899 Other long term (current) drug therapy: Secondary | ICD-10-CM | POA: Diagnosis not present

## 2020-01-22 DIAGNOSIS — Z7722 Contact with and (suspected) exposure to environmental tobacco smoke (acute) (chronic): Secondary | ICD-10-CM | POA: Diagnosis present

## 2020-01-22 DIAGNOSIS — Z20822 Contact with and (suspected) exposure to covid-19: Secondary | ICD-10-CM | POA: Diagnosis present

## 2020-01-22 DIAGNOSIS — J9601 Acute respiratory failure with hypoxia: Secondary | ICD-10-CM | POA: Diagnosis present

## 2020-01-22 DIAGNOSIS — Z888 Allergy status to other drugs, medicaments and biological substances status: Secondary | ICD-10-CM | POA: Diagnosis not present

## 2020-01-22 DIAGNOSIS — Z880 Allergy status to penicillin: Secondary | ICD-10-CM | POA: Diagnosis not present

## 2020-01-22 LAB — BASIC METABOLIC PANEL
Anion gap: 13 (ref 5–15)
BUN: 10 mg/dL (ref 6–20)
CO2: 27 mmol/L (ref 22–32)
Calcium: 9.3 mg/dL (ref 8.9–10.3)
Chloride: 99 mmol/L (ref 98–111)
Creatinine, Ser: 0.75 mg/dL (ref 0.61–1.24)
GFR, Estimated: >60 mL/min (ref >60)
Glucose, Bld: 135 mg/dL — ABNORMAL HIGH (ref 70–99)
Potassium: 4.2 mmol/L (ref 3.5–5.1)
Sodium: 139 mmol/L (ref 135–145)

## 2020-01-22 LAB — TROPONIN I (HIGH SENSITIVITY): Troponin I (High Sensitivity): 3 ng/L (ref <18)

## 2020-01-22 LAB — ECHOCARDIOGRAM COMPLETE
Area-P 1/2: 4.21 cm2
Height: 74 in
S' Lateral: 2.8 cm
Weight: 4409.2 oz

## 2020-01-22 LAB — CBC
HCT: 37.6 % — ABNORMAL LOW (ref 39.0–52.0)
Hemoglobin: 12.9 g/dL — ABNORMAL LOW (ref 13.0–17.0)
MCH: 29.7 pg (ref 26.0–34.0)
MCHC: 34.3 g/dL (ref 30.0–36.0)
MCV: 86.4 fL (ref 80.0–100.0)
Platelets: 289 10*3/uL (ref 150–400)
RBC: 4.35 MIL/uL (ref 4.22–5.81)
RDW: 12.4 % (ref 11.5–15.5)
WBC: 10.2 10*3/uL (ref 4.0–10.5)
nRBC: 0 % (ref 0.0–0.2)

## 2020-01-22 LAB — RESP PANEL BY RT-PCR (FLU A&B, COVID) ARPGX2
Influenza A by PCR: NEGATIVE
Influenza B by PCR: NEGATIVE
SARS Coronavirus 2 by RT PCR: NEGATIVE

## 2020-01-22 LAB — D-DIMER, QUANTITATIVE: D-Dimer, Quant: 0.89 ug/mL-FEU — ABNORMAL HIGH (ref 0.00–0.50)

## 2020-01-22 LAB — MAGNESIUM: Magnesium: 2 mg/dL (ref 1.7–2.4)

## 2020-01-22 MED ORDER — LACOSAMIDE 50 MG PO TABS: 500.0000 mg | ORAL_TABLET | Freq: Every day | ORAL | Status: DC

## 2020-01-22 MED ORDER — MORPHINE SULFATE (PF) 4 MG/ML IV SOLN
4.0000 mg | Freq: Once | INTRAVENOUS | Status: AC
  Administered 2020-01-22: 4 mg via INTRAVENOUS
  Filled 2020-01-22: qty 1

## 2020-01-22 MED ORDER — KETOROLAC TROMETHAMINE 15 MG/ML IJ SOLN
15.0000 mg | Freq: Four times a day (QID) | INTRAMUSCULAR | Status: AC | PRN
  Administered 2020-01-22 (×2): 15 mg via INTRAVENOUS
  Filled 2020-01-22 (×2): qty 1

## 2020-01-22 MED ORDER — LAMOTRIGINE ER 25 MG PO TB24: 50.0000 mg | ORAL_TABLET | Freq: Every day | ORAL | Status: DC

## 2020-01-22 MED ORDER — ONDANSETRON HCL 4 MG/2ML IJ SOLN
4.0000 mg | Freq: Once | INTRAMUSCULAR | Status: AC
  Administered 2020-01-22: 4 mg via INTRAVENOUS
  Filled 2020-01-22: qty 2

## 2020-01-22 MED ORDER — CENOBAMATE 200 MG PO TABS
200.0000 mg | ORAL_TABLET | Freq: Every day | ORAL | Status: DC
  Administered 2020-01-22: 200 mg via ORAL

## 2020-01-22 MED ORDER — ONDANSETRON HCL 4 MG/2ML IJ SOLN: 4.0000 mg | Freq: Four times a day (QID) | INTRAMUSCULAR | Status: DC | PRN

## 2020-01-22 MED ORDER — ACETAMINOPHEN 325 MG PO TABS
650.0000 mg | ORAL_TABLET | Freq: Four times a day (QID) | ORAL | Status: DC | PRN
  Administered 2020-01-22 – 2020-01-25 (×4): 650 mg via ORAL
  Filled 2020-01-22 (×4): qty 2

## 2020-01-22 MED ORDER — HEPARIN BOLUS VIA INFUSION
7000.0000 [IU] | Freq: Once | INTRAVENOUS | Status: DC
  Filled 2020-01-22: qty 7000

## 2020-01-22 MED ORDER — LACOSAMIDE 200 MG PO TABS
400.0000 mg | ORAL_TABLET | Freq: Every day | ORAL | Status: DC
  Administered 2020-01-22 – 2020-01-25 (×4): 400 mg via ORAL
  Filled 2020-01-22 (×4): qty 2

## 2020-01-22 MED ORDER — APIXABAN 5 MG PO TABS
10.0000 mg | ORAL_TABLET | Freq: Two times a day (BID) | ORAL | Status: DC
  Administered 2020-01-22 – 2020-01-26 (×9): 10 mg via ORAL
  Filled 2020-01-22 (×9): qty 2

## 2020-01-22 MED ORDER — LACTATED RINGERS IV SOLN: INTRAVENOUS | Status: DC

## 2020-01-22 MED ORDER — APIXABAN 5 MG PO TABS: 5.0000 mg | ORAL_TABLET | Freq: Two times a day (BID) | ORAL | Status: DC

## 2020-01-22 MED ORDER — DIVALPROEX SODIUM ER 500 MG PO TB24: 3000.0000 mg | ORAL_TABLET | Freq: Every day | ORAL | Status: DC

## 2020-01-22 MED ORDER — HEPARIN (PORCINE) 25000 UT/250ML-% IV SOLN: 1950.0000 [IU]/h | INTRAVENOUS | Status: DC

## 2020-01-22 MED ORDER — IOHEXOL 350 MG/ML SOLN
100.0000 mL | Freq: Once | INTRAVENOUS | Status: AC | PRN
  Administered 2020-01-22: 100 mL via INTRAVENOUS

## 2020-01-22 MED ORDER — ACETAMINOPHEN 650 MG RE SUPP: 650.0000 mg | Freq: Four times a day (QID) | RECTAL | Status: DC | PRN

## 2020-01-22 MED ORDER — DIVALPROEX SODIUM ER 500 MG PO TB24
1500.0000 mg | ORAL_TABLET | Freq: Every day | ORAL | Status: DC
  Administered 2020-01-22 – 2020-01-25 (×4): 1500 mg via ORAL
  Filled 2020-01-22 (×4): qty 3

## 2020-01-22 MED ORDER — OXYCODONE HCL 5 MG PO TABS
5.0000 mg | ORAL_TABLET | Freq: Four times a day (QID) | ORAL | Status: DC | PRN
  Administered 2020-01-22 – 2020-01-23 (×3): 5 mg via ORAL
  Filled 2020-01-22 (×3): qty 1

## 2020-01-22 MED ORDER — LORAZEPAM 2 MG/ML IJ SOLN: 1.0000 mg | INTRAMUSCULAR | Status: DC | PRN

## 2020-01-22 NOTE — H&P (Signed)
History and Physical    Joe Thomas ZOX:[id number] DOB: [birth year] DOA: 01/21/2020  PCP: Pcp, No   Patient coming from:  Home  Chief Complaint:  Right sided chest pain and SOB with exertion  HPI: Joe Thomas is a 19 y.o. male with medical history significant for epilepsy. He presents with complaint of right-sided chest pain.  This is described as sharp pain that started yesterday afternoon and has progressively worsened.  The pain gets worse with deep breathing, movement, palpation of chest and certain positions.  Denies cough congestion or fever.  Denies abdominal pain.  Denies nausea or vomiting. Pain radiates into his back. He used ibuprofen yesterday afternoon but it did not help much.  Patient denies history of PE or DVT denies hemoptysis. He denies unilateral lower extremity edema.  States his mother and maternal grandmother both had blood clots in their legs and lungs.  He is 2 weeks post having a neurostimulator placed to prevent seizures.  Is having pain overlying the device.  No significant pain immediately after having the device placed.  No redness no drainage again reiterated no fever. He has no history of cardiac disease  ED Course:  Found to have elevated D-dimer level and a PE on CTA with no heart strain. Has had oxygen in low to mid 90's on RA but will occasionally dip saturation level to 90% briefly.   Review of Systems:  General: Denies weakness, fever, chills, weight loss, night sweats.  Denies dizziness.  Denies change in appetite HENT: Denies head trauma, headache, denies change in hearing, tinnitus.  Denies nasal congestion or bleeding.  Denies sore throat, sores in mouth.  Denies difficulty swallowing Eyes: Denies blurry vision, pain in eye, drainage.  Denies discoloration of eyes. Neck: Denies pain.  Denies swelling.  Denies pain with movement. Cardiovascular: Reports right sided chest pain. Denies palpitations.  Denies edema.  Denies orthopnea Respiratory:  Reports shortness of breath. Denies cough.  Denies wheezing.  Denies sputum production Gastrointestinal: Denies abdominal pain, swelling.  Denies nausea, vomiting, diarrhea.  Denies melena.  Denies hematemesis. Musculoskeletal: Denies limitation of movement.  Denies deformity or swelling.  Denies pain.  Denies arthralgias or myalgias. Genitourinary: Denies pelvic pain.  Denies urinary frequency or hesitancy.  Denies dysuria.  Skin: Denies rash.  Denies petechiae, purpura, ecchymosis. Neurological: Denies headache.  Denies syncope. Denies paresthesia.  Denies slurred speech, drooping face.  Denies visual change. Psychiatric: Denies depression, anxiety.  Denies suicidal thoughts or ideation.  Denies hallucinations.  Past Medical History:  Diagnosis Date  . Brain damage   . Frontal lobe epilepsy (HCC)    seizures are always at night, per father; last seizure 01/23/2017  . Obesity   . Pilonidal disease 01/2017  . Vision abnormalities    " eyes jump around "    Past Surgical History:  Procedure Laterality Date  . ADENOIDECTOMY  2003  . IMPLANTATION VAGAL NERVE STIMULATOR  08/20/2016  . PILONIDAL CYST EXCISION N/A 01/30/2017   Procedure: EXCISION PILONIDAL CYST WITH OPEN PACKING;  Surgeon: Darnell LevelGerkin, Todd, MD;  Location: [facility] OR;  Service: General;  Laterality: N/A;  . TONSILLECTOMY  2003  . TYMPANOSTOMY TUBE PLACEMENT  2003    Social History  reports that he is a non-smoker but has been exposed to tobacco smoke. He has never used smokeless tobacco. He reports that he does not drink alcohol and does not use drugs.  Allergies  Allergen Reactions  . Keppra [Levetiracetam] Other (See Comments)  CAUSED MORE SEIZURES  . Penicillins Rash and Other (See Comments)    PATIENT HAS HAD A PCN REACTION WITH IMMEDIATE RASH, FACIAL/TONGUE/THROAT SWELLING, SOB, OR LIGHTHEADEDNESS WITH HYPOTENSION:  #  #  #  YES  #  #  #   Has patient had a PCN reaction causing severe rash involving mucus membranes or  skin necrosis: No Has patient had a PCN reaction that required hospitalization: No Has patient had a PCN reaction occurring within the last 10 years: No If all of the above answers are "NO", then may proceed with Cephalosporin use.     Family History  Problem Relation Age of Onset  . Multiple sclerosis Mother   . Diabetes Father   . COPD Maternal Grandmother   . Cancer Maternal Grandfather   . Stroke Paternal Grandmother   . Diabetes Paternal Grandfather   . Cancer Maternal Aunt      Prior to Admission medications   Medication Sig Start Date End Date Taking? Authorizing Provider  Cenobamate 200 MG TABS Take 200 mg by mouth at bedtime. 01/14/20  Yes [provider]  clonazePAM (KLONOPIN) 1 MG tablet Take 2 mg by mouth daily as needed for anxiety (multiple seizures).   Yes [provider]  divalproex (DEPAKOTE ER) 500 MG 24 hr tablet Take 3,000 mg by mouth at bedtime.   Yes [provider]  lacosamide (VIMPAT) 200 MG TABS tablet Take 500 mg by mouth at bedtime.    Yes [provider]  LamoTRIgine (LAMICTAL XR) 25 MG TB24 24 hour tablet Take 50 mg by mouth at bedtime. 01/14/20  Yes [provider]  Midazolam (NAYZILAM) 5 MG/0.1ML SOLN Place 1 each into the nose See admin instructions. Use 1 spray nasally as needed for seizures that presist 5 mins after trying clonazepam 09/04/19  Yes [provider]  Multiple Vitamins-Minerals (ONE-A-DAY FOR HIM VITACRAVES PO) Take 1 tablet by mouth daily.   Yes [provider]  Cenobamate (XCOPRI) 14 x 50 MG & 14 x100 MG TBPK Take 100 mg by mouth at bedtime. Patient not taking: No sig reported    [provider]    Physical Exam: Vitals:   01/22/20 0155 01/22/20 0215 01/22/20 0345 01/22/20 0413  BP:  115/79  111/76  Pulse:  (!) 101 98 96  Resp:  18 (!) 33 20  Temp: 98.8 F (37.1 C)   98.9 F (37.2 C)  TempSrc: Oral   Oral  SpO2:  91% 90%   Weight:      Height:         Constitutional: NAD, calm, comfortable Vitals:   01/22/20 0155 01/22/20 0215 01/22/20 0345 01/22/20 0413  BP:  115/79  111/76  Pulse:  (!) 101 98 96  Resp:  18 (!) 33 20  Temp: 98.8 F (37.1 C)   98.9 F (37.2 C)  TempSrc: Oral   Oral  SpO2:  91% 90%   Weight:      Height:       General: WDWN, Alert and oriented x3.  Eyes: EOMI, PERRL, conjunctivae normal.  Sclera nonicteric HENT:  /AT, external ears normal.  Nares patent without epistasis.  Mucous membranes are moist. Posterior pharynx clear of any exudate or lesions. Neck: Soft, normal range of motion, supple, no masses, no thyromegaly.  Trachea midline Respiratory: clear to auscultation bilaterally, no wheezing, no crackles. Normal respiratory effort. No accessory muscle use.  Cardiovascular: Regular rate and rhythm, no murmurs / rubs / gallops. No extremity edema.  2+ pedal pulses. Abdomen: Soft, no tenderness, nondistended, no rebound or guarding. Obese. No masses palpated. Bowel sounds normoactive Musculoskeletal: FROM. no clubbing / cyanosis. No joint deformity upper and lower extremities. Normal muscle tone.  Skin: Warm, dry, intact no rashes, lesions, ulcers. No induration Neurologic: Normal speech.  Sensation intact Psychiatric: Normal judgment and insight.  Normal mood.    Labs on Admission: I have personally reviewed following labs and imaging studies  CBC: Recent Labs  Lab 01/21/20 2136  WBC 8.9  HGB 13.8  HCT 40.8  MCV 85.2  PLT 308    Basic Metabolic Panel: Recent Labs  Lab 01/21/20 2136  NA 140  K 3.5  CL 101  CO2 26  GLUCOSE 110*  BUN 12  CREATININE 0.61  CALCIUM 9.4    GFR: Estimated Creatinine Clearance: 206.9 mL/min (by C-G formula based on SCr of 0.61 mg/dL).  Liver Function Tests: No results for input(s): AST, ALT, ALKPHOS, BILITOT, PROT, ALBUMIN in the last 168 hours.  Urine analysis:    Component Value Date/Time   COLORURINE YELLOW 11/20/2016 1415   APPEARANCEUR CLEAR  11/20/2016 1415   LABSPEC 1.019 11/20/2016 1415   PHURINE 5.0 11/20/2016 1415   GLUCOSEU NEGATIVE 11/20/2016 1415   HGBUR NEGATIVE 11/20/2016 1415   BILIRUBINUR NEGATIVE 11/20/2016 1415   KETONESUR NEGATIVE 11/20/2016 1415   PROTEINUR 30 (A) 11/20/2016 1415   NITRITE NEGATIVE 11/20/2016 1415   LEUKOCYTESUR NEGATIVE 11/20/2016 1415    Radiological Exams on Admission: DG Chest 2 View  Result Date: 01/21/2020 CLINICAL DATA:  Chest pain EXAM: CHEST - 2 VIEW COMPARISON:  None. FINDINGS: Stimulator generator over the lower chest. No focal opacity or pleural effusion. Normal cardiomediastinal silhouette. No pneumothorax. IMPRESSION: No active cardiopulmonary disease. Electronically Signed   By: Jasmine Pang M.D.   On: 01/21/2020 21:43   CT Angio Chest PE W and/or Wo Contrast  Result Date: 01/22/2020 CLINICAL DATA:  Shortness of breath EXAM: CT ANGIOGRAPHY CHEST WITH CONTRAST TECHNIQUE: Multidetector CT imaging of the chest was performed using the standard protocol during bolus administration of intravenous contrast. Multiplanar CT image reconstructions and MIPs were obtained to evaluate the vascular anatomy. CONTRAST:  OMNIPAQUE IOHEXOL 350 MG/ML SOLN COMPARISON:  None. FINDINGS: Cardiovascular: There is a optimal opacification of the pulmonary arteries. No central filling defects are seen. There is nearly occlusive thrombus in the posterior right lower lobe segmental and subsegmental arterial branches. The heart is normal in size. No pericardial effusion or thickening. No evidence right heart strain. There is normal three-vessel brachiocephalic anatomy without proximal stenosis. The thoracic aorta is normal in appearance. Mediastinum/Nodes: No hilar, mediastinal, or axillary adenopathy. Thyroid gland, trachea, and esophagus demonstrate no significant findings. Lungs/Pleura: Rounded patchy airspace opacity seen at the posterior right lung base. No pleural effusion or pneumothorax is seen. Upper  Abdomen: No acute abnormalities present in the visualized portions of the upper abdomen. Musculoskeletal: No chest wall abnormality. No acute or significant osseous findings. Review of the MIP images confirms the above findings. IMPRESSION: Nearly occlusive thrombus in the posterior right lower lobe segmental and subsegmental arterial branches. There is adjacent airspace opacities which could be due to atelectasis or pulmonary infarction. No evidence of right ventricular heart strain. Electronically Signed   By: Jonna Clark M.D.   On: 01/22/2020 03:02    EKG: Independently reviewed.  EKG shows sinus tachycardia with nonspecific ST changes.  no pathologic ST elevation or depression.  QTc is normal  Assessment/Plan Principal Problem:   Pulmonary  embolism [facility]) Mr. Plotkin is placed on medical telemetry for observation with acute PE. Started on Eliquis for anticoagulation.  Pt has family history of PE in his mother and maternal grandmother. Check for genetic clotting disorder with labs.   Active Problems:   Epilepsy (HCC) Continue home antiepileptic medications.  Patient has a neurostimulator as well.  Patient placed on seizure precautions   DVT prophylaxis: Is placed on Eliquis for anticoagulation Code Status:   Full code Family Communication:  Diagnosis plan discussed with patient and his mother who is at bedside.  Further recommendations to follow as clinical indicated Disposition Plan:   Patient is from:  Home  Anticipated DC to:  Home  Anticipated DC date:  Anticipate discharge in less than 2 midnights  Anticipated DC barriers: No barriers to discharge identified at this time    Admission status:  Observation  Carlton Adam MD [facility]  How to contact the [facility] Attending or Consulting provider 7A - 7P or covering provider during after hours 7P -7A, for this patient?   1. Check the care team in [facility] and look for a) attending/consulting [facility] provider listed and b) the  [facility] team listed 2. Log into [email or web address] and use [place]'s universal password to access. If you do not have the password, please contact the hospital operator. 3. Locate the [facility] provider you are looking for under [facility] and page to a number that you can be directly reached. 4. If you still have difficulty reaching the provider, please page the [facility] (Director on Call) for the Hospitalists listed on amion for assistance.  01/22/2020, 4:15 AM

## 2020-01-22 NOTE — Progress Notes (Addendum)
ANTICOAGULATION CONSULT NOTE - Initial Consult  Pharmacy Consult for heparin Indication: pulmonary embolus  Allergies  Allergen Reactions  . Keppra [Levetiracetam] Other (See Comments)    CAUSED MORE SEIZURES  . Penicillins Rash and Other (See Comments)    PATIENT HAS HAD A PCN REACTION WITH IMMEDIATE RASH, FACIAL/TONGUE/THROAT SWELLING, SOB, OR LIGHTHEADEDNESS WITH HYPOTENSION:  #  #  #  YES  #  #  #   Has patient had a PCN reaction causing severe rash involving mucus membranes or skin necrosis: No Has patient had a PCN reaction that required hospitalization: No Has patient had a PCN reaction occurring within the last 10 years: No If all of the above answers are "NO", then may proceed with Cephalosporin use.     Patient Measurements: Height: 6\' 2"  (188 cm) Weight: 125 kg (275 lb 9.2 oz) IBW/kg (Calculated) : 82.2 Heparin Dosing Weight: 109.4 kg  Vital Signs: Temp: 98.8 F (37.1 C) (12/10 0155) Temp Source: Oral (12/10 0155) BP: 116/85 (12/10 0145) Pulse Rate: 107 (12/10 0145)  Labs: Recent Labs    01/21/20 2136 01/21/20 2353  HGB 13.8  --   HCT 40.8  --   PLT 308  --   LABPROT 12.1  --   INR 0.9  --   CREATININE 0.61  --   TROPONINIHS 3 3    Estimated Creatinine Clearance: 206.9 mL/min (by C-G formula based on SCr of 0.61 mg/dL).   Medical History: Past Medical History:  Diagnosis Date  . Brain damage   . Frontal lobe epilepsy (HCC)    seizures are always at night, per father; last seizure 01/23/2017  . Obesity   . Pilonidal disease 01/2017  . Vision abnormalities    " eyes jump around "    Medications:  See medication history  Assessment: 19 yo man to start heparin for PE.  He was not on anticoagulation PTA.  Hg 13.8, PTLC 308 Goal of Therapy:  Heparin level 0.3-0.7 units/ml Monitor platelets by anticoagulation protocol: Yes   Plan:  Heparin bolus 7000 units and drip at 1950 units/hr Check heparin level in 6 hours Daily HL and CBC while on  heparin Monitor for bleeding complications  Seay, Lora Poteet 01/22/2020,3:25 AM   Addendum (0400) MD changed to apixaban.  14/11/2019, PharmD, BCPS Please see amion for complete clinical pharmacist phone list 01/22/2020 4:00 AM

## 2020-01-22 NOTE — Plan of Care (Signed)
  Problem: Education: Goal: Knowledge of General Education information will improve Description: Including pain rating scale, medication(s)/side effects and non-pharmacologic comfort measures Outcome: Progressing   Problem: Clinical Measurements: Goal: Ability to maintain clinical measurements within normal limits will improve Outcome: Progressing Goal: Respiratory complications will improve Outcome: Progressing   

## 2020-01-22 NOTE — ED Notes (Signed)
Lunch Tray Ordered @ 1050. 

## 2020-01-22 NOTE — Progress Notes (Signed)
Bilateral lower extremity venous study completed.      Please see CV Proc for preliminary results.   Jarrett Albor, RVT  

## 2020-01-22 NOTE — ED Notes (Signed)
Patient is back in bed on the monitor patient is resting with call bell in reach and family at bedside

## 2020-01-22 NOTE — ED Notes (Signed)
Walked patient to the bathroom patient did well 

## 2020-01-22 NOTE — Progress Notes (Signed)
  Echocardiogram 2D Echocardiogram has been performed.  Delcie Roch 01/22/2020, 3:37 PM

## 2020-01-22 NOTE — Progress Notes (Addendum)
Patient admitted earlier this morning.  H&P reviewed.  Patient seen and examined.  Patient's father was at the bedside.  Patient noted to be somnolent.  He was given narcotics few hours ago.  Patient complains of 6 out of 10 pain in the right back area.  Denies any shortness of breath.  He had neurostimulator placed in the last 2 to 3 weeks at [facility].  This is for his history of seizure disorder.  Vital signs reviewed.  Noted to be on 2 L of oxygen saturating in the late 90s.  Rest of the vital signs are stable.  S1-S2 is normal regular.  No S3-S4. Lungs are clear to auscultation bilaterally. Abdomen is soft. Mild swelling noted in bilateral lower extremities.  Labs from this morning are pending.  Presented with complains of right-sided back and chest pain.  Noted to have acute pulmonary embolism.  Apparently there is some family history of venous thromboembolism as well.  Hypercoagulable panel appears to have been ordered.  However patient was in [facility] for neurostimulator placement in the last few weeks.  He apparently had 2 different procedures.  That could be the reason for his pulmonary embolism.  Lower extremity Doppler studies predominantly did not show any DVT.  Echocardiogram has been ordered and is pending.  Continue Eliquis.  Patient continues to have significant pain associated with his pulmonary embolism requiring intravenous pain medications.  Waiting for his pain to be adequately controlled before he can be discharged.  Discussed with his father who was at the bedside.  Patient also with significant history of seizure disorder.  As mentioned above a neurostimulator was placed a few weeks ago at [facility].  I did speak to the nurse with the neurology clinic.  The medications that he is on for his epilepsy were reviewed with her.  Doses were confirmed.  Patient will need to be mobilized.  Once pain is adequately controlled he can be discharged.  Hopefully in the  next 24 hours.  Osvaldo Shipper 01/22/2020

## 2020-01-22 NOTE — Plan of Care (Signed)
  Problem: Safety: Goal: Ability to remain free from injury will improve Outcome: Progressing   Problem: Pain Managment: Goal: General experience of comfort will improve Outcome: Progressing   

## 2020-01-22 NOTE — ED Notes (Signed)
Patient to 43M via stretcher w father and transport tech. On room air. Alert and calm at this time. All belongings including VNS/DBS devices with father.

## 2020-01-22 NOTE — ED Notes (Signed)
Pt placed on O2 due to oxygen dropping to 90% on RA.

## 2020-01-22 NOTE — Progress Notes (Signed)
New Admission Note: Arrrived via stretcher on room air with father at bedside. Patient able to transfer from stretcher to bed  With no assistance. Gait steady.  Arrival Method: stretcher Mental Orientation: alert and oriented x3  Telemetry: Cardiac monitor # [id number]  Assessment: Completed Skin: warm and dry IV: LR infusing at 100 cc/hr  Pain: in a scale of 4/10  Tubes: none  Safety Measures: Safety Fall Prevention Plan has been given, discussed and signed Admission: Completed [facility] Orientation: Patient has been orientated to the room, unit and staff.  Family: Father at bedside Orders have been reviewed and implemented. Will continue to monitor the patient. Call light has been placed within reach and bed alarm has been activated.   Katrina Stack, RN

## 2020-01-23 DIAGNOSIS — G40909 Epilepsy, unspecified, not intractable, without status epilepticus: Secondary | ICD-10-CM

## 2020-01-23 DIAGNOSIS — R509 Fever, unspecified: Secondary | ICD-10-CM

## 2020-01-23 LAB — BASIC METABOLIC PANEL
Anion gap: 10 (ref 5–15)
BUN: 10 mg/dL (ref 6–20)
CO2: 29 mmol/L (ref 22–32)
Calcium: 8.9 mg/dL (ref 8.9–10.3)
Chloride: 100 mmol/L (ref 98–111)
Creatinine, Ser: 0.67 mg/dL (ref 0.61–1.24)
GFR, Estimated: >60 mL/min (ref >60)
Glucose, Bld: 99 mg/dL (ref 70–99)
Potassium: 4 mmol/L (ref 3.5–5.1)
Sodium: 139 mmol/L (ref 135–145)

## 2020-01-23 LAB — CBC
HCT: 33.7 % — ABNORMAL LOW (ref 39.0–52.0)
Hemoglobin: 11.1 g/dL — ABNORMAL LOW (ref 13.0–17.0)
MCH: 28.7 pg (ref 26.0–34.0)
MCHC: 32.9 g/dL (ref 30.0–36.0)
MCV: 87.1 fL (ref 80.0–100.0)
Platelets: 230 10*3/uL (ref 150–400)
RBC: 3.87 MIL/uL — ABNORMAL LOW (ref 4.22–5.81)
RDW: 12.4 % (ref 11.5–15.5)
WBC: 9.6 10*3/uL (ref 4.0–10.5)
nRBC: 0 % (ref 0.0–0.2)

## 2020-01-23 MED ORDER — APIXABAN (ELIQUIS) VTE STARTER PACK (10MG AND 5MG): ORAL_TABLET | ORAL | 0 refills | Status: AC

## 2020-01-23 MED ORDER — KETOROLAC TROMETHAMINE 30 MG/ML IJ SOLN
30.0000 mg | Freq: Three times a day (TID) | INTRAMUSCULAR | Status: DC | PRN
  Administered 2020-01-24 – 2020-01-26 (×5): 30 mg via INTRAVENOUS
  Filled 2020-01-23 (×5): qty 1

## 2020-01-23 MED ORDER — CENOBAMATE 200 MG PO TABS
200.0000 mg | ORAL_TABLET | Freq: Every day | ORAL | Status: DC
  Administered 2020-01-23 – 2020-01-25 (×3): 200 mg via ORAL
  Filled 2020-01-23: qty 1

## 2020-01-23 MED ORDER — APIXABAN 5 MG PO TABS: 5.0000 mg | ORAL_TABLET | Freq: Two times a day (BID) | ORAL | 2 refills | Status: AC

## 2020-01-23 MED ORDER — KETOROLAC TROMETHAMINE 30 MG/ML IJ SOLN
30.0000 mg | Freq: Three times a day (TID) | INTRAMUSCULAR | Status: DC | PRN
  Administered 2020-01-23 (×2): 30 mg via INTRAVENOUS
  Filled 2020-01-23 (×2): qty 1

## 2020-01-23 MED ORDER — HYDROCODONE-ACETAMINOPHEN 5-325 MG PO TABS
1.0000 | ORAL_TABLET | ORAL | Status: DC | PRN
  Administered 2020-01-23 – 2020-01-24 (×2): 1 via ORAL
  Filled 2020-01-23 (×2): qty 1

## 2020-01-23 MED ORDER — HOME MED STORE IN PYXIS
1.0000 | Freq: Every day | Status: DC
  Filled 2020-01-23: qty 1

## 2020-01-23 NOTE — Progress Notes (Signed)
[facility] PROGRESS NOTE   Joe Thomas ZOX:[id number] DOB: Jul 02, 2000 DOA: 01/21/2020  PCP: Pcp, No  Brief History/Interval Summary: 19 year old male with past medical history of epilepsy presented with right-sided chest and back pain.  Was found to have acute pulmonary embolism.  Was hospitalized for further management.  Recently was admitted at [facility] for neuro stimulator placement.  Reason for Visit: Acute pulmonary embolism  Consultants: None  Procedures: None  Antibiotics: Anti-infectives (From admission, onward)   None      Subjective/Interval History: Patient is somnolent this morning but easily arousable.  Overnight events noted.  Patient noted to have a lot of chest pain.  He was given pain medications.  Denies any shortness of breath.  No other symptoms    Assessment/Plan:  Acute pulmonary embolism Etiology unclear but patient was recently hospitalized for neurostimulator placement.  This was at [facility].  Could be the precipitant reason for his pulmonary embolism.  May benefit from hypercoagulable work-up in the outpatient setting. Lower extremity Doppler study without DVT.  Echocardiogram shows normal systolic function.  No evidence for heart strain. Continue apixaban for now.  Fever Patient noted to have fever this afternoon.  WBC noted to be normal this morning.  Likely due to his venous thromboembolism.  No other source of infection found.  We will check a UA.  Blood cultures.  History of epilepsy Followed by neurology at [facility].  Had a neurostimulator placed recently.  On multiple antiepileptics.  Seizure precautions.  Obesity Estimated body mass index is 35.38 kg/m as calculated from the following:   Height as of this encounter:  (1.88 m).   Weight as of this encounter: 125 kg.   DVT Prophylaxis: Currently on apixaban Code Status: Full code Family Communication: Mother being updated  daily Disposition Plan: Hopefully return home when improved  Status is: Inpatient  Remains inpatient appropriate because:Ongoing active pain requiring inpatient pain management   Dispo:  Patient From: Home  Planned Disposition: Home  Expected discharge date: 01/24/2020  Medically stable for discharge: No        Medications:  Scheduled: . apixaban  10 mg Oral BID   Followed by  . [START ON 01/29/2020] apixaban  5 mg Oral BID  . Cenobamate  200 mg Oral QHS  . divalproex  1,500 mg Oral QHS  . lacosamide  400 mg Oral QHS   Continuous:  WJX:BJYNWGNFAOZHY **OR** acetaminophen, HYDROcodone-acetaminophen, ketorolac, LORazepam, ondansetron (ZOFRAN) IV   Objective:  Vital Signs  Vitals:   01/23/20 0206 01/23/20 0424 01/23/20 0430 01/23/20 1215  BP: 110/63 113/67  127/73  Pulse: 92 99  (!) 101  Resp: (!) Temp: 99 F (37.2 C) 98.8 F (37.1 C)  (!) 100.5 F (38.1 C)  TempSrc: Oral Oral  Oral  SpO2: 97% (!) 87% 94% 97%  Weight:      Height:        Intake/Output Summary (Last 24 hours) at 01/23/2020 1358 Last data filed at 01/23/2020 0900 Gross per 24 hour  Intake 2176.81 ml  Output 0 ml  Net 2176.81 ml   Filed Weights   01/21/20 2122  Weight: 125 kg    General appearance: Somnolent but easily arousable. Resp: Diminished air entry at the bases.  Normal effort at rest.  No wheezing or rhonchi. Cardio: S1-S2 is normal regular.  No S3-S4.  No rubs murmurs or bruit GI: Abdomen is soft.  Nontender nondistended.  Bowel sounds  are present normal.  No masses organomegaly Extremities: No edema.  Full range of motion of lower extremities. Neurologic:   No focal neurological deficits.    Lab Results:  Data Reviewed: I have personally reviewed following labs and imaging studies  CBC: Recent Labs  Lab 01/21/20 2136 01/22/20 1016 01/23/20 0500  WBC 8.9 10.2 9.6  HGB 13.8 12.9* 11.1*  HCT 40.8 37.6* 33.7*  MCV 85.2 86.4 87.1  PLT 308 289 230     Basic Metabolic Panel: Recent Labs  Lab 01/21/20 2136 01/22/20 1016 01/23/20 0500  NA 140 139 139  K 3.5 4.2 4.0  CL 101 99 100  CO2 GLUCOSE 110* 135* 99  BUN CREATININE 0.61 0.75 0.67  CALCIUM 9.4 9.3 8.9  MG  --  2.0  --     GFR: Estimated Creatinine Clearance: 206.9 mL/min (by C-G formula based on SCr of 0.67 mg/dL).   Coagulation Profile: Recent Labs  Lab 01/21/20 2136  INR 0.9    CBG: Recent Labs  Lab 01/16/20 2324  GLUCAP 68*      Recent Results (from the past 240 hour(s))  Resp Panel by RT-PCR (Flu A&B, Covid) Nasopharyngeal Swab     Status: None   Collection Time: 01/22/20  5:37 AM   Specimen: Nasopharyngeal Swab; Nasopharyngeal(NP) swabs in vial transport medium  Result Value Ref Range Status   SARS Coronavirus 2 by RT PCR NEGATIVE NEGATIVE Final    Comment: (NOTE) SARS-CoV-2 target nucleic acids are NOT DETECTED.  The SARS-CoV-2 RNA is generally detectable in upper respiratory specimens during the acute phase of infection. The lowest concentration of SARS-CoV-2 viral copies this assay can detect is 138 copies/mL. A negative result does not preclude SARS-Cov-2 infection and should not be used as the sole basis for treatment or other patient management decisions. A negative result may occur with  improper specimen collection/handling, submission of specimen other than nasopharyngeal swab, presence of viral mutation(s) within the areas targeted by this assay, and inadequate number of viral copies(<138 copies/mL). A negative result must be combined with clinical observations, patient history, and epidemiological information. The expected result is Negative.  Fact Sheet for Patients:  BloggerCourse.com  Fact Sheet for Healthcare Providers:  SeriousBroker.it  This test is no t yet approved or cleared by the [place] FDA and  has been authorized for detection and/or  diagnosis of SARS-CoV-2 by FDA under an Emergency Use Authorization (EUA). This EUA will remain  in effect (meaning this test can be used) for the duration of the COVID-19 declaration under Section 564(b)(1) of the Act, 21 U.S.C.section 360bbb-3(b)(1), unless the authorization is terminated  or revoked sooner.       Influenza A by PCR NEGATIVE NEGATIVE Final   Influenza B by PCR NEGATIVE NEGATIVE Final    Comment: (NOTE) The Xpert Xpress SARS-CoV-2/FLU/RSV plus assay is intended as an aid in the diagnosis of influenza from Nasopharyngeal swab specimens and should not be used as a sole basis for treatment. Nasal washings and aspirates are unacceptable for Xpert Xpress SARS-CoV-2/FLU/RSV testing.  Fact Sheet for Patients: BloggerCourse.com  Fact Sheet for Healthcare Providers: SeriousBroker.it  This test is not yet approved or cleared by the [place] FDA and has been authorized for detection and/or diagnosis of SARS-CoV-2 by FDA under an Emergency Use Authorization (EUA). This EUA will remain in effect (meaning this test can be used) for the duration of the COVID-19 declaration under Section 564(b)(1) of the  Act, 21 U.S.C. section 360bbb-3(b)(1), unless the authorization is terminated or revoked.  Performed at [facility], [address], [place], [place] [address]       Radiology Studies: DG Chest 2 View  Result Date: 01/21/2020 CLINICAL DATA:  Chest pain EXAM: CHEST - 2 VIEW COMPARISON:  None. FINDINGS: Stimulator generator over the lower chest. No focal opacity or pleural effusion. Normal cardiomediastinal silhouette. No pneumothorax. IMPRESSION: No active cardiopulmonary disease. Electronically Signed   By: Jasmine Pang M.D.   On: 01/21/2020 21:43   CT Angio Chest PE W and/or Wo Contrast  Result Date: 01/22/2020 CLINICAL DATA:  Shortness of breath EXAM: CT ANGIOGRAPHY CHEST WITH CONTRAST TECHNIQUE:  Multidetector CT imaging of the chest was performed using the standard protocol during bolus administration of intravenous contrast. Multiplanar CT image reconstructions and MIPs were obtained to evaluate the vascular anatomy. CONTRAST:  OMNIPAQUE IOHEXOL 350 MG/ML SOLN COMPARISON:  None. FINDINGS: Cardiovascular: There is a optimal opacification of the pulmonary arteries. No central filling defects are seen. There is nearly occlusive thrombus in the posterior right lower lobe segmental and subsegmental arterial branches. The heart is normal in size. No pericardial effusion or thickening. No evidence right heart strain. There is normal three-vessel brachiocephalic anatomy without proximal stenosis. The thoracic aorta is normal in appearance. Mediastinum/Nodes: No hilar, mediastinal, or axillary adenopathy. Thyroid gland, trachea, and esophagus demonstrate no significant findings. Lungs/Pleura: Rounded patchy airspace opacity seen at the posterior right lung base. No pleural effusion or pneumothorax is seen. Upper Abdomen: No acute abnormalities present in the visualized portions of the upper abdomen. Musculoskeletal: No chest wall abnormality. No acute or significant osseous findings. Review of the MIP images confirms the above findings. IMPRESSION: Nearly occlusive thrombus in the posterior right lower lobe segmental and subsegmental arterial branches. There is adjacent airspace opacities which could be due to atelectasis or pulmonary infarction. No evidence of right ventricular heart strain. Electronically Signed   By: Jonna Clark M.D.   On: 01/22/2020 03:02   ECHOCARDIOGRAM COMPLETE  Result Date: 01/22/2020    ECHOCARDIOGRAM REPORT   Patient Name:   Joe Thomas [facility] Date of Exam: 01/22/2020 Medical Rec #:  [id number]        Height:       74.0 in Accession #:    [id number]       Weight:       275.6 lb Date of Birth:  2000/02/19        BSA:          2.491 m Patient Age:    19 years         BP:            115/92 mmHg Patient Gender: M                HR:           92 bpm. Exam Location:  Inpatient Procedure: 2D Echo Indications:    pulmonary embolus 415.19  History:        Patient has no prior history of Echocardiogram examinations.  Sonographer:    Delcie Roch Referring Phys: [id number] Brittany Osier IMPRESSIONS  1. Left ventricular ejection fraction, by estimation, is 60 to 65%. The left ventricle has normal function. The left ventricle has no regional wall motion abnormalities. Left ventricular diastolic parameters were normal.  2. Right ventricular systolic function is normal. The right ventricular size is normal.  3. The mitral valve is normal in structure. No evidence  of mitral valve regurgitation. No evidence of mitral stenosis.  4. The aortic valve is normal in structure. Aortic valve regurgitation is not visualized. No aortic stenosis is present.  5. The inferior vena cava is normal in size with greater than 50% respiratory variability, suggesting right atrial pressure of 3 mmHg. FINDINGS  Left Ventricle: Left ventricular ejection fraction, by estimation, is 60 to 65%. The left ventricle has normal function. The left ventricle has no regional wall motion abnormalities. The left ventricular internal cavity size was normal in size. There is  no left ventricular hypertrophy. Left ventricular diastolic parameters were normal. Right Ventricle: The right ventricular size is normal. No increase in right ventricular wall thickness. Right ventricular systolic function is normal. Left Atrium: Left atrial size was normal in size. Right Atrium: Right atrial size was normal in size. Pericardium: There is no evidence of pericardial effusion. Mitral Valve: The mitral valve is normal in structure. No evidence of mitral valve regurgitation. No evidence of mitral valve stenosis. Tricuspid Valve: The tricuspid valve is normal in structure. Tricuspid valve regurgitation is not demonstrated. No evidence of tricuspid stenosis.  Aortic Valve: The aortic valve is normal in structure. Aortic valve regurgitation is not visualized. No aortic stenosis is present. Pulmonic Valve: The pulmonic valve was normal in structure. Pulmonic valve regurgitation is not visualized. No evidence of pulmonic stenosis. Aorta: The aortic root is normal in size and structure. Venous: The inferior vena cava is normal in size with greater than 50% respiratory variability, suggesting right atrial pressure of 3 mmHg. IAS/Shunts: No atrial level shunt detected by color flow Doppler.  LEFT VENTRICLE PLAX 2D LVIDd:         4.30 cm Diastology LVIDs:         2.80 cm LV e' medial:    8.92 cm/s LV PW:         1.00 cm LV E/e' medial:  10.6 LV IVS:        1.00 cm LV e' lateral:   17.20 cm/s                        LV E/e' lateral: 5.5  RIGHT VENTRICLE RV S prime:     15.00 cm/s TAPSE (M-mode): 2.1 cm LEFT ATRIUM             Index       RIGHT ATRIUM           Index LA diam:        3.50 cm 1.41 cm/m  RA Area:     12.70 cm LA Vol (A2C):   44.7 ml 17.95 ml/m RA Volume:   26.20 ml  10.52 ml/m LA Vol (A4C):   37.5 ml 15.06 ml/m LA Biplane Vol: 42.6 ml 17.10 ml/m  AORTIC VALVE LVOT Vmax:   162.00 cm/s LVOT Vmean:  117.000 cm/s LVOT VTI:    0.275 m  AORTA Ao Asc diam: 3.20 cm MITRAL VALVE MV Area (PHT): 4.21 cm    SHUNTS MV Decel Time: 180 msec    Systemic VTI: 0.28 m MV E velocity: 94.30 cm/s MV A velocity: 65.30 cm/s MV E/A ratio:  1.44 Donato Schultz MD Electronically signed by Donato Schultz MD Signature Date/Time: 01/22/2020/3:55:44 PM    Final    VAS [place] LOWER EXTREMITY VENOUS (DVT)  Result Date: 01/22/2020  Lower Venous DVT Study Indications: SOB, Swelling, and pulmonary embolism.  Comparison Study: No previous exam Performing Technologist: Clint Guy  Examination Guidelines: A  complete evaluation includes B-mode imaging, spectral Doppler, color Doppler, and power Doppler as needed of all accessible portions of each vessel. Bilateral testing is considered an integral part  of a complete examination. Limited examinations for reoccurring indications may be performed as noted. The reflux portion of the exam is performed with the patient in reverse Trendelenburg.  +---------+---------------+---------+-----------+----------+--------------+ RIGHT    CompressibilityPhasicitySpontaneityPropertiesThrombus Aging +---------+---------------+---------+-----------+----------+--------------+ CFV      Full           Yes      Yes                                 +---------+---------------+---------+-----------+----------+--------------+ SFJ      Full                                                        +---------+---------------+---------+-----------+----------+--------------+ FV Prox  Full                                                        +---------+---------------+---------+-----------+----------+--------------+ FV Mid   Full                                                        +---------+---------------+---------+-----------+----------+--------------+ FV DistalFull                                                        +---------+---------------+---------+-----------+----------+--------------+ PFV      Full                                                        +---------+---------------+---------+-----------+----------+--------------+ POP      Full           Yes      Yes                                 +---------+---------------+---------+-----------+----------+--------------+ PTV      Full                                                        +---------+---------------+---------+-----------+----------+--------------+ PERO     Full                                                        +---------+---------------+---------+-----------+----------+--------------+   +---------+---------------+---------+-----------+----------+--------------+  LEFT     CompressibilityPhasicitySpontaneityPropertiesThrombus Aging  +---------+---------------+---------+-----------+----------+--------------+ CFV      Full           Yes      Yes                                 +---------+---------------+---------+-----------+----------+--------------+ SFJ      Full                                                        +---------+---------------+---------+-----------+----------+--------------+ FV Prox  Full                                                        +---------+---------------+---------+-----------+----------+--------------+ FV Mid   Full                                                        +---------+---------------+---------+-----------+----------+--------------+ FV DistalFull                                                        +---------+---------------+---------+-----------+----------+--------------+ PFV      Full                                                        +---------+---------------+---------+-----------+----------+--------------+ POP      Full           Yes      Yes                                 +---------+---------------+---------+-----------+----------+--------------+ PTV      Full                                                        +---------+---------------+---------+-----------+----------+--------------+ PERO     Full                                                        +---------+---------------+---------+-----------+----------+--------------+     Summary: RIGHT: - There is no evidence of deep vein thrombosis in the lower extremity.  - No cystic structure found in the popliteal fossa.  LEFT: - There is no evidence of deep vein thrombosis in the lower extremity.  - No  cystic structure found in the popliteal fossa.  *See table(s) above for measurements and observations.    Preliminary        LOS: 1 day   Breyah Akhter [employer] on [email or web address]  01/23/2020, 1:58 PM

## 2020-01-23 NOTE — Progress Notes (Signed)
Patient supplied medication Cenobamate [name]) 200mg  tabs sent to pharmacy.

## 2020-01-23 NOTE — Progress Notes (Signed)
°   01/23/20 1215  Assess: MEWS Score  Temp (!) 100.5 F (38.1 C)  BP 127/73  Pulse Rate (!) 101  Resp 16  SpO2 97 %  Assess: MEWS Score  MEWS Temp 1  MEWS Systolic 0  MEWS Pulse 1  MEWS RR 0  MEWS LOC 0  MEWS Score 2  MEWS Score Color Yellow  Assess: if the MEWS score is Yellow or Red  Were vital signs taken at a resting state? Yes  Focused Assessment No change from prior assessment  Early Detection of Sepsis Score *See Row Information* Low  MEWS guidelines implemented *See Row Information* No, previously yellow, continue vital signs every 4 hours  Notify: Charge Nurse/RN  Name of Charge Nurse/RN Notified Alphonzo Lemmings  Date Charge Nurse/RN Notified 01/23/20  Time Charge Nurse/RN Notified 1248  Notify: Provider  Provider Name/Title Dr. Maryln Manuel  Date Provider Notified 01/24/20  Time Provider Notified 1250  Notification Type Page  Notification Reason Other (Comment) (temp 101.2)  Document  Patient Outcome Stabilized after interventions  Progress note created (see row info) Yes

## 2020-01-23 NOTE — Progress Notes (Signed)
Patient home medication Cenobamate 200 mgs is with the pharmacy and are going to dispense the medication.

## 2020-01-23 NOTE — Progress Notes (Signed)
   01/23/20 0011  Assess: MEWS Score  Temp 98.3 F (36.8 C)  BP 132/88  Pulse Rate (!) 118  Resp (!) 25  Level of Consciousness Alert  SpO2 94 %  O2 Device Nasal Cannula  O2 Flow Rate (L/min) 1 L/min  Assess: MEWS Score  MEWS Temp 0  MEWS Systolic 0  MEWS Pulse 2  MEWS RR 1  MEWS LOC 0  MEWS Score 3  MEWS Score Color Yellow  Assess: if the MEWS score is Yellow or Red  Were vital signs taken at a resting state? Yes  Focused Assessment Change from prior assessment (see assessment flowsheet)  Early Detection of Sepsis Score *See Row Information* Low  MEWS guidelines implemented *See Row Information* Yes  Treat  Pain Scale 0-10  Pain Score 8  Faces Pain Scale 4  Pain Type Acute pain  Pain Location Chest  Pain Orientation Right (pain when breathing)  Pain Radiating Towards back  Pain Descriptors / Indicators Aching  Pain Frequency Intermittent  Pain Onset On-going  Patients Stated Pain Goal 0  Pain Intervention(s) MD notified (Comment)  Multiple Pain Sites No  Breathing 1  Negative Vocalization 1  Body Language 0  Consolability 0  Take Vital Signs  Increase Vital Sign Frequency  Yellow: Q 2hr X 2 then Q 4hr X 2, if remains yellow, continue Q 4hrs  Escalate  MEWS: Escalate Yellow: discuss with charge nurse/RN and consider discussing with provider and RRT  Notify: Charge Nurse/RN  Name of Charge Nurse/RN Notified Joseph Pierini., RN  Date Charge Nurse/RN Notified 01/23/20  Time Charge Nurse/RN Notified 0016  Notify: Provider  Provider Name/Title MD Rathore  Date Provider Notified 01/23/20  Time Provider Notified [phone]  Notification Type Page  Notification Reason Other (Comment) (pt. is having right chest pain when breathing 8/10)  Notify: Rapid Response  Name of Rapid Response RN Notified On call provider notified  Document  Progress note created (see row info) Yes

## 2020-01-24 ENCOUNTER — Inpatient Hospital Stay (HOSPITAL_COMMUNITY): Payer: 59

## 2020-01-24 LAB — COMPREHENSIVE METABOLIC PANEL
ALT: 38 U/L (ref 0–44)
AST: 38 U/L (ref 15–41)
Albumin: 2.8 g/dL — ABNORMAL LOW (ref 3.5–5.0)
Alkaline Phosphatase: 69 U/L (ref 38–126)
Anion gap: 12 (ref 5–15)
BUN: 10 mg/dL (ref 6–20)
CO2: 28 mmol/L (ref 22–32)
Calcium: 8.8 mg/dL — ABNORMAL LOW (ref 8.9–10.3)
Chloride: 99 mmol/L (ref 98–111)
Creatinine, Ser: 0.73 mg/dL (ref 0.61–1.24)
GFR, Estimated: >60 mL/min (ref >60)
Glucose, Bld: 91 mg/dL (ref 70–99)
Potassium: 4 mmol/L (ref 3.5–5.1)
Sodium: 139 mmol/L (ref 135–145)
Total Bilirubin: 1.2 mg/dL (ref 0.3–1.2)
Total Protein: 6.7 g/dL (ref 6.5–8.1)

## 2020-01-24 LAB — CBC
HCT: 31 % — ABNORMAL LOW (ref 39.0–52.0)
Hemoglobin: 10.6 g/dL — ABNORMAL LOW (ref 13.0–17.0)
MCH: 29.5 pg (ref 26.0–34.0)
MCHC: 34.2 g/dL (ref 30.0–36.0)
MCV: 86.4 fL (ref 80.0–100.0)
Platelets: 233 10*3/uL (ref 150–400)
RBC: 3.59 MIL/uL — ABNORMAL LOW (ref 4.22–5.81)
RDW: 12.2 % (ref 11.5–15.5)
WBC: 8.4 10*3/uL (ref 4.0–10.5)
nRBC: 0 % (ref 0.0–0.2)

## 2020-01-24 LAB — URINALYSIS, ROUTINE W REFLEX MICROSCOPIC
Bacteria, UA: NONE SEEN
Glucose, UA: NEGATIVE mg/dL
Hgb urine dipstick: NEGATIVE
Ketones, ur: 20 mg/dL — AB
Leukocytes,Ua: NEGATIVE
Nitrite: NEGATIVE
Protein, ur: 30 mg/dL — AB
Specific Gravity, Urine: 1.025 (ref 1.005–1.030)
pH: 5 (ref 5.0–8.0)

## 2020-01-24 LAB — LUPUS ANTICOAGULANT PANEL
DRVVT: 46.5 s (ref 0.0–47.0)
PTT Lupus Anticoagulant: 41.1 s (ref 0.0–51.9)

## 2020-01-24 LAB — VALPROIC ACID LEVEL: Valproic Acid Lvl: 88 ug/mL (ref 50.0–100.0)

## 2020-01-24 MED ORDER — SODIUM CHLORIDE 0.9% FLUSH
3.0000 mL | Freq: Two times a day (BID) | INTRAVENOUS | Status: DC
  Administered 2020-01-24 – 2020-01-25 (×2): 3 mL via INTRAVENOUS

## 2020-01-24 MED ORDER — SODIUM CHLORIDE 0.9% FLUSH: 3.0000 mL | INTRAVENOUS | Status: DC | PRN

## 2020-01-24 MED ORDER — SODIUM CHLORIDE 0.9 % IV SOLN
100.0000 mg | Freq: Two times a day (BID) | INTRAVENOUS | Status: DC
  Administered 2020-01-24 – 2020-01-26 (×4): 100 mg via INTRAVENOUS
  Filled 2020-01-24 (×5): qty 100

## 2020-01-24 MED ORDER — LEVOFLOXACIN IN D5W 750 MG/150ML IV SOLN: 750.0000 mg | INTRAVENOUS | Status: DC

## 2020-01-24 NOTE — Progress Notes (Signed)
°   01/24/20 0420  Assess: MEWS Score  Temp (!) 101.4 F (38.6 C)  BP (!) 107/48  Pulse Rate (!) 107  Resp 18  SpO2 95 %  O2 Device Nasal Cannula  Assess: MEWS Score  MEWS Temp 1  MEWS Systolic 0  MEWS Pulse 1  MEWS RR 0  MEWS LOC 0  MEWS Score 2  MEWS Score Color Yellow  Assess: if the MEWS score is Yellow or Red  Were vital signs taken at a resting state? Yes  Focused Assessment No change from prior assessment (this happened on day shift yesterday)  Early Detection of Sepsis Score *See Row Information* Low  MEWS guidelines implemented *See Row Information* Yes  Treat  MEWS Interventions Administered prn meds/treatments;Other (Comment) (Tylenol for fever)  Take Vital Signs  Increase Vital Sign Frequency  Yellow: Q 2hr X 2 then Q 4hr X 2, if remains yellow, continue Q 4hrs  Escalate  MEWS: Escalate Yellow: discuss with charge nurse/RN and consider discussing with provider and RRT  Notify: Charge Nurse/RN  Name of Charge Nurse/RN Notified Emmanuel RN  Date Charge Nurse/RN Notified 01/24/20  Time Charge Nurse/RN Notified 0430  Document  Patient Outcome Other (Comment) (Will recheck temp. to see if Tylenol is effective)  Progress note created (see row info) Yes

## 2020-01-24 NOTE — TOC Initial Note (Signed)
Transition of Care [facility]) - Initial/Assessment Note    Patient Details  Name: Joe Thomas MRN: [id number] Date of Birth: Jan 06, 2001  Transition [facility]) CM/SW Contact:    Dianna [place], [place] Phone Number: [phone] 01/24/2020, 4:45 PM  Clinical Narrative:  [facility] team called pt. Spoke to father- Sharrieff Spratlin. He shares pt. Is asleep. Discussed the resource for Eliquis. Father shares pt has Nurse, learning disability- Medical sales representative (through father) and Medicaid (through mother). Discussed the benefit of the Eliquis free trial and co-pay card. Plan- provide resource to patient. 1430- Present to pt's room to provide Eliquis resource. Pt resting in bed with eyes closed. Let resource in the room. Call to father notifying him that resource was left. He acknowledges and agrees to resource.                Expected Discharge Plan: Home/Self Care Barriers to Discharge: No Barriers Identified   Patient Goals and CMS Choice        Expected Discharge Plan and Services Expected Discharge Plan: Home/Self Care                                              Prior Living Arrangements/Services     Patient language and need for interpreter reviewed:: No        Need for Family Participation in Patient Care: Yes (Comment) Care giver support system in place?: Yes (comment) (Father- Rosanne Ashing and Mother- Maralyn Sago)   Criminal Activity/Legal Involvement Pertinent to Current Situation/Hospitalization: No - Comment as needed  Activities of Daily Living      Permission Sought/Granted                  Emotional Assessment Appearance:: Appears stated age,Well-Groomed            Admission diagnosis:  Pulmonary embolism (HCC) [I26.99] Acute pulmonary embolism (HCC) [I26.99] Single subsegmental pulmonary embolism without acute cor pulmonale (HCC) [I26.93] Patient Active Problem List   Diagnosis Date Noted  . Pulmonary embolism (HCC) 01/22/2020  . Acute pulmonary embolism (HCC)  01/22/2020  . Pilonidal disease 01/29/2017  . Epilepsy (HCC) 11/15/2013   PCP:  Pcp, No Pharmacy:   [facility] [place] [place] - [place] [address] [place] [address] Phone: [phone] Fax: [phone]     Social Determinants of Health (SDOH) Interventions    Readmission Risk Interventions No flowsheet data found.

## 2020-01-24 NOTE — Progress Notes (Signed)
   01/24/20 1721  Assess: MEWS Score  Temp (!) 101.6 F (38.7 C)  BP 127/71  Pulse Rate 100  Resp 18  SpO2 98 %  Assess: MEWS Score  MEWS Temp 2  MEWS Systolic 0  MEWS Pulse 0  MEWS RR 0  MEWS LOC 0  MEWS Score 2  MEWS Score Color Yellow  Assess: if the MEWS score is Yellow or Red  Were vital signs taken at a resting state? Yes  Focused Assessment Change from prior assessment (see assessment flowsheet)  Early Detection of Sepsis Score *See Row Information* Low  MEWS guidelines implemented *See Row Information* Yes  Treat  MEWS Interventions Administered prn meds/treatments;Escalated (See documentation below)  Pain Scale 0-10  Pain Score 0  Take Vital Signs  Increase Vital Sign Frequency  Yellow: Q 2hr X 2 then Q 4hr X 2, if remains yellow, continue Q 4hrs  Escalate  MEWS: Escalate Yellow: discuss with charge nurse/RN and consider discussing with provider and RRT  Notify: Charge Nurse/RN  Name of Charge Nurse/RN Notified Wilmore, RN  Date Charge Nurse/RN Notified 01/24/20  Time Charge Nurse/RN Notified 1721  Notify: Provider  Provider Name/Title Dr. Osvaldo Shipper  Date Provider Notified 01/24/20  Time Provider Notified 1736  Notification Type Page  Notification Reason Change in status  Response No new orders  Document  Patient Outcome Other (Comment) (under observation)  Progress note created (see row info) Yes

## 2020-01-24 NOTE — Progress Notes (Signed)
Tylenol po given for temp of 101.6 F.  Call placed to on call MD due to change in MEWS score.

## 2020-01-24 NOTE — Progress Notes (Addendum)
[facility] PROGRESS NOTE   Joe Thomas FBX:[id number] DOB: 2000-10-05 DOA: 01/21/2020  PCP: Pcp, No  Brief History/Interval Summary: 19 year old male with past medical history of epilepsy presented with right-sided chest and back pain.  Was found to have acute pulmonary embolism.  Was hospitalized for further management.  Recently was admitted at [facility] for neuro stimulator placement.  Reason for Visit: Acute pulmonary embolism  Consultants: None  Procedures: None  Antibiotics: Anti-infectives (From admission, onward)   None      Subjective/Interval History: Patient noted to be sitting up in the chair.  More awake than yesterday but still very sleepy.  Denies any headache.  Has had a cough which has been dry for the most part.  Chest pain is about the same as yesterday.      Assessment/Plan:  Acute pulmonary embolism/acute respiratory failure with hypoxia Etiology unclear but patient was recently hospitalized for neurostimulator placement.  This was at [facility].  Could be the precipitant reason for his pulmonary embolism.  May benefit from hypercoagulable work-up in the outpatient setting. Lower extremity Doppler study without DVT.  Echocardiogram shows normal systolic function.  No evidence for heart strain. Continue apixaban for now.  Continues to have significant pleuritic pain.  Continue pain medications as needed.  Also noted to be on 2 L of oxygen by nasal cannula.  Try to wean it to off.  Fever Noted to have fever over the last 24 hours.  UA did not suggest infection.  Blood cultures were sent yesterday and negative so far.  Patient denies any headaches.  Fever most likely due to acute PE.  Since he did mention cough this morning we will proceed with chest x-ray although the cough could be due to his PE.  WBC remains normal.  No infectious source identified so far.  Continue to hold off on antibiotics.    History of  epilepsy Followed by neurology at [facility]  Had a neurostimulator placed recently.  On multiple antiepileptics.  Seizure precautions.  Denies any headaches currently.  Responding appropriately.  No indication for neuroimaging at this time.  Due to persistent somnolence and history of recent neurosurgery procedure we decided to go ahead with CT scan of the head which does not show any acute findings.  Will check valproic acid level. May need to discuss with his neurologist at [facility] if this persist.  Chest x-ray does show evidence for right basilar pneumonia.  We will place the patient on doxycycline for now.  Father was notified of these findings.  Normocytic anemia Mild drop in hemoglobin is dilutional.  No evidence of overt bleeding.  Obesity Estimated body mass index is 34.93 kg/m as calculated from the following:   Height as of this encounter: 6\' 2"  (1.88 m).   Weight as of this encounter: 123.4 kg.   DVT Prophylaxis: Currently on apixaban Code Status: Full code Family Communication: Mother being updated daily Disposition Plan: Hopefully return home when improved.  Needs to be mobilized  Status is: Inpatient  Remains inpatient appropriate because:Ongoing active pain requiring inpatient pain management   Dispo:  Patient From: Home  Planned Disposition: Home  Expected discharge date: 01/24/2020  Medically stable for discharge: No        Medications:  Scheduled: . apixaban  10 mg Oral BID   Followed by  . [START ON 01/29/2020] apixaban  5 mg Oral BID  . Cenobamate  200 mg Oral QHS  . divalproex  1,500  mg Oral QHS  . home med stored in pyxis  1 each Oral QHS  . lacosamide  400 mg Oral QHS  . sodium chloride flush  3 mL Intravenous Q12H   Continuous:  [facility]:ENIDPOEUMPNTI **OR** acetaminophen, HYDROcodone-acetaminophen, ketorolac, LORazepam, ondansetron (ZOFRAN) IV, sodium chloride flush   Objective:  Vital Signs  Vitals:   01/24/20 0420 01/24/20 0557  01/24/20 0627 01/24/20 0958  BP: (!) 107/48 113/64 102/66 109/72  Pulse: (!) 107 (!) 105 91 97  Resp: 18 18 18 18   Temp: (!) 101.4 F (38.6 C) 99.2 F (37.3 C) 99.8 F (37.7 C) (!) 97.5 F (36.4 C)  TempSrc: Oral Oral Oral Oral  SpO2: 95% 94% 94% 97%  Weight:      Height:        Intake/Output Summary (Last 24 hours) at 01/24/2020 1019 Last data filed at 01/24/2020 0500 Gross per 24 hour  Intake 80 ml  Output 250 ml  Net -170 ml   Filed Weights   01/21/20 2122 01/23/20 2020  Weight: 125 kg 123.4 kg    General appearance: Somnolent was easily arousable. No erythema noted around the insertion site on his head. Resp: Diminished air entry at the right base with few crackles.  No rhonchi.  No wheezing.   Cardio: S1-S2 is normal regular.  No S3-S4.  No rubs murmurs or bruit GI: Abdomen is soft.  Nontender nondistended.  Bowel sounds are present normal.  No masses organomegaly Extremities: No edema.  Full range of motion of lower extremities. Neurologic: No obvious focal neurological deficits noted.    Lab Results:  Data Reviewed: I have personally reviewed following labs and imaging studies  CBC: Recent Labs  Lab 01/21/20 2136 01/22/20 1016 01/23/20 0500 01/24/20 0222  WBC 8.9 10.2 9.6 8.4  HGB 13.8 12.9* 11.1* 10.6*  HCT 40.8 37.6* 33.7* 31.0*  MCV 85.2 86.4 87.1 86.4  PLT 308 289 230 233    Basic Metabolic Panel: Recent Labs  Lab 01/21/20 2136 01/22/20 1016 01/23/20 0500 01/24/20 0222  NA 140 139 139 139  K 3.5 4.2 4.0 4.0  CL 101 99 100 99  CO2 26 27 29 28   GLUCOSE 110* 135* 99 91  BUN 12 10 10 10   CREATININE 0.61 0.75 0.67 0.73  CALCIUM 9.4 9.3 8.9 8.8*  MG  --  2.0  --   --     GFR: Estimated Creatinine Clearance: 205.6 mL/min (by C-G formula based on SCr of 0.73 mg/dL).   Coagulation Profile: Recent Labs  Lab 01/21/20 2136  INR 0.9    CBG: No results for input(s): GLUCAP in the last 168 hours.    Recent Results (from the past 240  hour(s))  Resp Panel by RT-PCR (Flu A&B, Covid) Nasopharyngeal Swab     Status: None   Collection Time: 01/22/20  5:37 AM   Specimen: Nasopharyngeal Swab; Nasopharyngeal(NP) swabs in vial transport medium  Result Value Ref Range Status   SARS Coronavirus 2 by RT PCR NEGATIVE NEGATIVE Final    Comment: (NOTE) SARS-CoV-2 target nucleic acids are NOT DETECTED.  The SARS-CoV-2 RNA is generally detectable in upper respiratory specimens during the acute phase of infection. The lowest concentration of SARS-CoV-2 viral copies this assay can detect is 138 copies/mL. A negative result does not preclude SARS-Cov-2 infection and should not be used as the sole basis for treatment or other patient management decisions. A negative result may occur with  improper specimen collection/handling, submission of specimen other than nasopharyngeal swab,  presence of viral mutation(s) within the areas targeted by this assay, and inadequate number of viral copies(<138 copies/mL). A negative result must be combined with clinical observations, patient history, and epidemiological information. The expected result is Negative.  Fact Sheet for Patients:  https://www.fda.gov/media/152166/download  Fact Sheet for Healthcare Providers:  SeriousBroker.ithttps://www.fda.gov/media/152162/download  This test is no t yet approved or cleared by the [place] FDA and  has been authorized for detection and/or diagnosis of SARS-CoV-2 by FDA under an Emergency Use Authorization (EUA). ThisBloggerCourse.com EUA will remain  in effect (meaning this test can be used) for the duration of the COVID-19 declaration under Section 564(b)(1) of the Act, 21 U.S.C.section 360bbb-3(b)(1), unless the authorization is terminated  or revoked sooner.       Influenza A by PCR NEGATIVE NEGATIVE Final   Influenza B by PCR NEGATIVE NEGATIVE Final    Comment: (NOTE) The Xpert Xpress SARS-CoV-2/FLU/RSV plus assay is intended as an aid in the diagnosis of influenza from  Nasopharyngeal swab specimens and should not be used as a sole basis for treatment. Nasal washings and aspirates are unacceptable for Xpert Xpress SARS-CoV-2/FLU/RSV testing.  Fact Sheet for Patients: BloggerCourse.comhttps://www.fda.gov/media/152166/download  Fact Sheet for Healthcare Providers: SeriousBroker.ithttps://www.fda.gov/media/152162/download  This test is not yet approved or cleared by the [place] FDA and has been authorized for detection and/or diagnosis of SARS-CoV-2 by FDA under an Emergency Use Authorization (EUA). This EUA will remain in effect (meaning this test can be used) for the duration of the COVID-19 declaration under Section 564(b)(1) of the Act, 21 U.S.C. section 360bbb-3(b)(1), unless the authorization is terminated or revoked.  Performed at [facility], [address] [place], [place] [address]   Culture, blood (Routine X 2) w Reflex to ID Panel     Status: None (Preliminary result)   Collection Time: 01/23/20  3:02 PM   Specimen: BLOOD  Result Value Ref Range Status   Specimen Description BLOOD LEFT ANTECUBITAL  Final   Special Requests   Final    BOTTLES DRAWN AEROBIC AND ANAEROBIC Blood Culture results may not be optimal due to an excessive volume of blood received in culture bottles   Culture   Final    NO GROWTH <12 HOURS Performed at [facility], [address] [place], [place] [address]    Report Status PENDING  Incomplete  Culture, blood (Routine X 2) w Reflex to ID Panel     Status: None (Preliminary result)   Collection Time: 01/23/20  3:12 PM   Specimen: BLOOD  Result Value Ref Range Status   Specimen Description BLOOD BLOOD RIGHT HAND  Final   Special Requests   Final    BOTTLES DRAWN AEROBIC AND ANAEROBIC Blood Culture adequate volume   Culture   Final    NO GROWTH <12 HOURS Performed at [facility], [address] [place], [place] [address]    Report Status PENDING  Incomplete      Radiology Studies: ECHOCARDIOGRAM  COMPLETE  Result Date: 01/22/2020    ECHOCARDIOGRAM REPORT   [facility] Date of Exam: 01/22/2020 Medical Rec #:  [id number]        Height:       74.0 in Accession #:    [id number]       Weight:       275.6 lb Date of Birth:  08-15-2000        BSA:          2.491  m Patient Age:    19 years         BP:           115/92 mmHg Patient Gender: M                HR:           92 bpm. Exam Location:  Inpatient Procedure: 2D Echo Indications:    pulmonary embolus 415.19  History:        Patient has no prior history of Echocardiogram examinations.  Sonographer:    Delcie Roch Referring Phys: [id number] Emonte Dieujuste IMPRESSIONS  1. Left ventricular ejection fraction, by estimation, is 60 to 65%. The left ventricle has normal function. The left ventricle has no regional wall motion abnormalities. Left ventricular diastolic parameters were normal.  2. Right ventricular systolic function is normal. The right ventricular size is normal.  3. The mitral valve is normal in structure. No evidence of mitral valve regurgitation. No evidence of mitral stenosis.  4. The aortic valve is normal in structure. Aortic valve regurgitation is not visualized. No aortic stenosis is present.  5. The inferior vena cava is normal in size with greater than 50% respiratory variability, suggesting right atrial pressure of 3 mmHg. FINDINGS  Left Ventricle: Left ventricular ejection fraction, by estimation, is 60 to 65%. The left ventricle has normal function. The left ventricle has no regional wall motion abnormalities. The left ventricular internal cavity size was normal in size. There is  no left ventricular hypertrophy. Left ventricular diastolic parameters were normal. Right Ventricle: The right ventricular size is normal. No increase in right ventricular wall thickness. Right ventricular systolic function is normal. Left Atrium: Left atrial size was normal in size. Right Atrium: Right atrial size was normal in size.  Pericardium: There is no evidence of pericardial effusion. Mitral Valve: The mitral valve is normal in structure. No evidence of mitral valve regurgitation. No evidence of mitral valve stenosis. Tricuspid Valve: The tricuspid valve is normal in structure. Tricuspid valve regurgitation is not demonstrated. No evidence of tricuspid stenosis. Aortic Valve: The aortic valve is normal in structure. Aortic valve regurgitation is not visualized. No aortic stenosis is present. Pulmonic Valve: The pulmonic valve was normal in structure. Pulmonic valve regurgitation is not visualized. No evidence of pulmonic stenosis. Aorta: The aortic root is normal in size and structure. Venous: The inferior vena cava is normal in size with greater than 50% respiratory variability, suggesting right atrial pressure of 3 mmHg. IAS/Shunts: No atrial level shunt detected by color flow Doppler.  LEFT VENTRICLE PLAX 2D LVIDd:         4.30 cm Diastology LVIDs:         2.80 cm LV e' medial:    8.92 cm/s LV PW:         1.00 cm LV E/e' medial:  10.6 LV IVS:        1.00 cm LV e' lateral:   17.20 cm/s                        LV E/e' lateral: 5.5  RIGHT VENTRICLE RV S prime:     15.00 cm/s TAPSE (M-mode): 2.1 cm LEFT ATRIUM             Index       RIGHT ATRIUM           Index LA diam:        3.50 cm 1.41 cm/m  RA  Area:     12.70 cm LA Vol (A2C):   44.7 ml 17.95 ml/m RA Volume:   26.20 ml  10.52 ml/m LA Vol (A4C):   37.5 ml 15.06 ml/m LA Biplane Vol: 42.6 ml 17.10 ml/m  AORTIC VALVE LVOT Vmax:   162.00 cm/s LVOT Vmean:  117.000 cm/s LVOT VTI:    0.275 m  AORTA Ao Asc diam: 3.20 cm MITRAL VALVE MV Area (PHT): 4.21 cm    SHUNTS MV Decel Time: 180 msec    Systemic VTI: 0.28 m MV E velocity: 94.30 cm/s MV A velocity: 65.30 cm/s MV E/A ratio:  1.44 Donato Schultz MD Electronically signed by Donato Schultz MD Signature Date/Time: 01/22/2020/3:55:44 PM    Final        LOS: 2 days   Osvaldo Shipper  [facility] Pager on  [email or web address]  01/24/2020, 10:19 AM

## 2020-01-24 NOTE — Discharge Instructions (Signed)
Pulmonary Embolism  A pulmonary embolism (PE) is a sudden blockage or decrease of blood flow in one or both lungs. Most blockages come from a blood clot that forms in the vein of a lower leg, thigh, or arm (deep vein thrombosis, DVT) and travels to the lungs. A clot is blood that has thickened into a gel or solid. PE is a dangerous and life-threatening condition that needs to be treated right away. What are the causes? This condition is usually caused by a blood clot that forms in a vein and moves to the lungs. In rare cases, it may be caused by air, fat, part of a tumor, or other tissue that moves through the veins and into the lungs. What increases the risk? The following factors may make you more likely to develop this condition:  Experiencing a traumatic injury, such as breaking a hip or leg.  Having: ? A spinal cord injury. ? Orthopedic surgery, especially hip or knee replacement. ? Any major surgery. ? A stroke. ? DVT. ? Blood clots or blood clotting disease. ? Long-term (chronic) lung or heart disease. ? Cancer treated with chemotherapy. ? A central venous catheter.  Taking medicines that contain estrogen. These include birth control pills and hormone replacement therapy.  Being: ? Pregnant. ? In the period of time after your baby is delivered (postpartum). ? Older than age 60. ? Overweight. ? A smoker, especially if you have other risks. What are the signs or symptoms? Symptoms of this condition usually start suddenly and include:  Shortness of breath during activity or at rest.  Coughing, coughing up blood, or coughing up blood-tinged mucus.  Chest pain that is often worse with deep breaths.  Rapid or irregular heartbeat.  Feeling light-headed or dizzy.  Fainting.  Feeling anxious.  Fever.  Sweating.  Pain and swelling in a leg. This is a symptom of DVT, which can lead to PE. How is this diagnosed? This condition may be diagnosed based on:  Your medical  history.  A physical exam.  Blood tests.  CT pulmonary angiogram. This test checks blood flow in and around your lungs.  Ventilation-perfusion scan, also called a lung VQ scan. This test measures air flow and blood flow to the lungs.  An ultrasound of the legs. How is this treated? Treatment for this condition depends on many factors, such as the cause of your PE, your risk for bleeding or developing more clots, and other medical conditions you have. Treatment aims to remove, dissolve, or stop blood clots from forming or growing larger. Treatment may include:  Medicines, such as: ? Blood thinning medicines (anticoagulants) to stop clots from forming. ? Medicines that dissolve clots (thrombolytics).  Procedures, such as: ? Using a flexible tube to remove a blood clot (embolectomy) or to deliver medicine to destroy it (catheter-directed thrombolysis). ? Inserting a filter into a large vein that carries blood to the heart (inferior vena cava). This filter (vena cava filter) catches blood clots before they reach the lungs. ? Surgery to remove the clot (surgical embolectomy). This is rare. You may need a combination of immediate, long-term (up to 3 months after diagnosis), and extended (more than 3 months after diagnosis) treatments. Your treatment may continue for several months (maintenance therapy). You and your health care provider will work together to choose the treatment program that is best for you. Follow these instructions at home: Medicines  Take over-the-counter and prescription medicines only as told by your health care provider.  If you   are taking an anticoagulant medicine: ? Take the medicine every day at the same time each day. ? Understand what foods and drugs interact with your medicine. ? Understand the side effects of this medicine, including excessive bruising or bleeding. Ask your health care provider or pharmacist about other side effects. General  instructions  Wear a medical alert bracelet or carry a medical alert card that says you have had a PE and lists what medicines you take.  Ask your health care provider when you may return to your normal activities. Avoid sitting or lying for a long time without moving.  Maintain a healthy weight. Ask your health care provider what weight is healthy for you.  Do not use any products that contain nicotine or tobacco, such as cigarettes, e-cigarettes, and chewing tobacco. If you need help quitting, ask your health care provider.  Talk with your health care provider about any travel plans. It is important to make sure that you are still able to take your medicine while on trips.  Keep all follow-up visits as told by your health care provider. This is important. Contact a health care provider if:  You missed a dose of your blood thinner medicine. Get help right away if:  You have: ? New or increased pain, swelling, warmth, or redness in an arm or leg. ? Numbness or tingling in an arm or leg. ? Shortness of breath during activity or at rest. ? A fever. ? Chest pain. ? A rapid or irregular heartbeat. ? A severe headache. ? Vision changes. ? A serious fall or accident, or you hit your head. ? Stomach (abdominal) pain. ? Blood in your vomit, stool, or urine. ? A cut that will not stop bleeding.  You cough up blood.  You feel light-headed or dizzy.  You cannot move your arms or legs.  You are confused or have memory loss. These symptoms may represent a serious problem that is an emergency. Do not wait to see if the symptoms will go away. Get medical help right away. Call your local emergency services (911 in the [place] Do not drive yourself to the hospital. Summary  A pulmonary embolism (PE) is a sudden blockage or decrease of blood flow in one or both lungs. PE is a dangerous and life-threatening condition that needs to be treated right away.  Treatments for this condition usually  include medicines to thin your blood (anticoagulants) or medicines to break apart blood clots (thrombolytics).  If you are given blood thinners, it is important to take the medicine every day at the same time each day.  Understand what foods and drugs interact with any medicines that you are taking.  If you have signs of PE or DVT, call your local emergency services (911 in the [place] This information is not intended to replace advice given to you by your health care provider. Make sure you discuss any questions you have with your health care provider. Document Revised: 11/06/2017 Document Reviewed: 11/06/2017 Elsevier Patient Education  2020 [employer].  Information on my medicine - ELIQUIS (apixaban)  This medication education was reviewed with me or my healthcare representative as part of my discharge preparation.  The pharmacist that spoke with me during my hospital stay was:  Ulyses Southward, RPH-CPP  Why was Eliquis prescribed for you? Eliquis was prescribed to treat blood clots that may have been found in the veins of your legs (deep vein thrombosis) or in your lungs (pulmonary embolism) and to reduce the risk  of them occurring again.  What do You need to know about Eliquis ? The starting dose is 10 mg (two 5 mg tablets) taken TWICE daily for the FIRST SEVEN (7) DAYS, then on (enter date)  01/29/20  the dose is reduced to ONE 5 mg tablet taken TWICE daily.  Eliquis may be taken with or without food.   Try to take the dose about the same time in the morning and in the evening. If you have difficulty swallowing the tablet whole please discuss with your pharmacist how to take the medication safely.  Take Eliquis exactly as prescribed and DO NOT stop taking Eliquis without talking to the doctor who prescribed the medication.  Stopping may increase your risk of developing a new blood clot.  Refill your prescription before you run out.  After discharge, you should have regular check-up  appointments with your healthcare provider that is prescribing your Eliquis.    What do you do if you miss a dose? If a dose of ELIQUIS is not taken at the scheduled time, take it as soon as possible on the same day and twice-daily administration should be resumed. The dose should not be doubled to make up for a missed dose.  Important Safety Information A possible side effect of Eliquis is bleeding. You should call your healthcare provider right away if you experience any of the following: ? Bleeding from an injury or your nose that does not stop. ? Unusual colored urine (red or dark brown) or unusual colored stools (red or black). ? Unusual bruising for unknown reasons. ? A serious fall or if you hit your head (even if there is no bleeding).  Some medicines may interact with Eliquis and might increase your risk of bleeding or clotting while on Eliquis. To help avoid this, consult your healthcare provider or pharmacist prior to using any new prescription or non-prescription medications, including herbals, vitamins, non-steroidal anti-inflammatory drugs (NSAIDs) and supplements.  This website has more information on Eliquis (apixaban): http://www.eliquis.com/eliquis/home

## 2020-01-25 DIAGNOSIS — J189 Pneumonia, unspecified organism: Secondary | ICD-10-CM

## 2020-01-25 LAB — CBC
HCT: 30.3 % — ABNORMAL LOW (ref 39.0–52.0)
Hemoglobin: 10 g/dL — ABNORMAL LOW (ref 13.0–17.0)
MCH: 29 pg (ref 26.0–34.0)
MCHC: 33 g/dL (ref 30.0–36.0)
MCV: 87.8 fL (ref 80.0–100.0)
Platelets: 269 10*3/uL (ref 150–400)
RBC: 3.45 MIL/uL — ABNORMAL LOW (ref 4.22–5.81)
RDW: 12.1 % (ref 11.5–15.5)
WBC: 6.9 10*3/uL (ref 4.0–10.5)
nRBC: 0 % (ref 0.0–0.2)

## 2020-01-25 LAB — COMPREHENSIVE METABOLIC PANEL
ALT: 38 U/L (ref 0–44)
AST: 26 U/L (ref 15–41)
Albumin: 2.7 g/dL — ABNORMAL LOW (ref 3.5–5.0)
Alkaline Phosphatase: 80 U/L (ref 38–126)
Anion gap: 12 (ref 5–15)
BUN: 10 mg/dL (ref 6–20)
CO2: 28 mmol/L (ref 22–32)
Calcium: 8.8 mg/dL — ABNORMAL LOW (ref 8.9–10.3)
Chloride: 99 mmol/L (ref 98–111)
Creatinine, Ser: 0.56 mg/dL — ABNORMAL LOW (ref 0.61–1.24)
GFR, Estimated: >60 mL/min (ref >60)
Glucose, Bld: 100 mg/dL — ABNORMAL HIGH (ref 70–99)
Potassium: 3.8 mmol/L (ref 3.5–5.1)
Sodium: 139 mmol/L (ref 135–145)
Total Bilirubin: 0.7 mg/dL (ref 0.3–1.2)
Total Protein: 7 g/dL (ref 6.5–8.1)

## 2020-01-25 LAB — CARDIOLIPIN ANTIBODIES, IGG, IGM, IGA
Anticardiolipin IgA: <9 APL U/mL (ref 0–11)
Anticardiolipin IgG: <9 GPL U/mL (ref 0–14)
Anticardiolipin IgM: <9 MPL U/mL (ref 0–12)

## 2020-01-25 MED ORDER — ORAL CARE MOUTH RINSE
15.0000 mL | Freq: Two times a day (BID) | OROMUCOSAL | Status: DC
  Administered 2020-01-25 (×2): 15 mL via OROMUCOSAL

## 2020-01-25 NOTE — Progress Notes (Signed)
01/25/2020 11:33 AM   Patient family member, Syon Tews, called to request adding an additional family member to the list to assistant patient with activities of daily living. Photographer. Added Alisa Graff to list. Maralyn Sago verbalized understanding of visitor policy and complying with one visitor in room at a time.   Jodel Mayhall MSN, RN-BC, CNML [facility] Phone: [phone]

## 2020-01-25 NOTE — Progress Notes (Signed)
[facility] PROGRESS NOTE   Joe Thomas GEX:[id number] DOB: December 10, 2000 DOA: 01/21/2020  PCP: Pcp, No  Brief History/Interval Summary: 19 year old male with past medical history of epilepsy presented with right-sided chest and back pain.  Was found to have acute pulmonary embolism.  Was hospitalized for further management.  Recently was admitted at [facility] for neuro stimulator placement.  Reason for Visit: Acute pulmonary embolism  Consultants: None  Procedures: None  Antibiotics: Anti-infectives (From admission, onward)   Start     Dose/Rate Route Frequency Ordered Stop   01/24/20 1630  levofloxacin (LEVAQUIN) IVPB 750 mg  Status:  Discontinued        750 mg 100 mL/hr over 90 Minutes Intravenous Every 24 hours 01/24/20 1533 01/24/20 1536   01/24/20 1630  doxycycline (VIBRAMYCIN) 100 mg in sodium chloride 0.9 % 250 mL IVPB        100 mg 125 mL/hr over 120 Minutes Intravenous Every 12 hours 01/24/20 1536        Subjective/Interval History: Patient noted to be awake alert.  He was witnessed walking to the bathroom.  Denies any headache.  Continues to have a cough with sputum production although he does not know what color his sputum.  Chest pain seems to be better.      Assessment/Plan:  Acute pulmonary embolism/acute respiratory failure with hypoxia Etiology unclear but patient was recently hospitalized for neurostimulator placement.  This was at [facility].  Could be the precipitant reason for his pulmonary embolism.  May benefit from hypercoagulable work-up in the outpatient setting. Lower extremity Doppler study without DVT.  Echocardiogram shows normal systolic function.  No evidence for heart strain. Patient was started on apixaban which is being continued.  Patient had significant pleuritic pain which seems to be improving.  Noted to be on 2 L of oxygen.  Wean off if possible.  Fever/right lower lobe pneumonia Patient noted to have  fevers starting about 2 days ago.  Initially thought to be due to his venous thromboembolism.  However fever was persistent.  He started having cough.  Chest x-ray was done which raised concern for pneumonia in the right lung.  Could be related to the PE.  The finding could be pulmonary infarction but due to persistent fever patient was started on doxycycline.  Continue to monitor for now.  WBC remains normal UA did not suggest infection.  Cultures have been negative so far.  History of epilepsy/acute metabolic encephalopathy Followed by neurology at [facility].  Had a neurostimulator placed recently.  On multiple antiepileptics.  Seizure precautions.   Patient has been very somnolent here in the hospital.  A CT head was done yesterday which did not show any acute findings.  Valproic acid level was noted to be therapeutic.  Patient is wide-awake today.  Patient appears to be quite sensitive to sedative agents.  High fevers could also be responsible for his somnolence.  Will discuss with his neurologist at [facility] but do not anticipate that we will need to do any further work-up as long as he remains stable.    Normocytic anemia Mild drop in hemoglobin is dilutional.  No evidence of overt bleeding.  Obesity Estimated body mass index is 34.93 kg/m as calculated from the following:   Height as of this encounter: 6\' 2"  (1.88 m).   Weight as of this encounter: 123.4 kg.   DVT Prophylaxis: Currently on apixaban Code Status: Full code Family Communication: Parents being updated daily Disposition Plan:  Hopefully return home when improved.  Needs to be mobilized  Status is: Inpatient  Remains inpatient appropriate because:Ongoing active pain requiring inpatient pain management   Dispo:  Patient From: Home  Planned Disposition: Home  Expected discharge date: 01/24/2020  Medically stable for discharge: No        Medications:  Scheduled: . apixaban  10 mg Oral BID   Followed by  .  [START ON 01/29/2020] apixaban  5 mg Oral BID  . Cenobamate  200 mg Oral QHS  . divalproex  1,500 mg Oral QHS  . home med stored in pyxis  1 each Oral QHS  . lacosamide  400 mg Oral QHS  . mouth rinse  15 mL Mouth Rinse BID  . sodium chloride flush  3 mL Intravenous Q12H   Continuous: . doxycycline (VIBRAMYCIN) IV 100 mg (01/25/20 0439)   DJM:[id number] **OR** acetaminophen, ketorolac, LORazepam, ondansetron (ZOFRAN) IV, sodium chloride flush   Objective:  Vital Signs  Vitals:   01/24/20 2037 01/25/20 0040 01/25/20 0431 01/25/20 0930  BP: 110/63 119/75 111/75 119/70  Pulse: 77 85 76 84  Resp:  20 20 19   Temp: 100 F (37.8 C) 98.7 F (37.1 C) 98.4 F (36.9 C) 98.3 F (36.8 C)  TempSrc: Oral Oral Oral Oral  SpO2: 96% 100% 99% 97%  Weight:      Height:        Intake/Output Summary (Last 24 hours) at 01/25/2020 1057 Last data filed at 01/25/2020 0600 Gross per 24 hour  Intake 1017.54 ml  Output --  Net 1017.54 ml   Filed Weights   01/21/20 2122 01/23/20 2020  Weight: 125 kg 123.4 kg    General appearance: Patient wide-awake today.  In no distress. Resp: Diminished air entry at the right base with few crackles. Cardio: S1-S2 is normal regular.  No S3-S4.  No rubs murmurs or bruit GI: Abdomen is soft.  Nontender nondistended.  Bowel sounds are present normal.  No masses organomegaly Extremities: No edema.  Full range of motion of lower extremities. Neurologic: Alert and oriented x3.  No focal neurological deficits.    Lab Results:  Data Reviewed: I have personally reviewed following labs and imaging studies  CBC: Recent Labs  Lab 01/21/20 2136 01/22/20 1016 01/23/20 0500 01/24/20 0222 01/25/20 0713  WBC 8.9 10.2 9.6 8.4 6.9  HGB 13.8 12.9* 11.1* 10.6* 10.0*  HCT 40.8 37.6* 33.7* 31.0* 30.3*  MCV 85.2 86.4 87.1 86.4 87.8  PLT 308 289 230 233 269    Basic Metabolic Panel: Recent Labs  Lab 01/21/20 2136 01/22/20 1016 01/23/20 0500 01/24/20 0222  01/25/20 0713  NA 140 139 139 139 139  K 3.5 4.2 4.0 4.0 3.8  CL 101 99 100 99 99  CO2 26 27 29 28 28   GLUCOSE 110* 135* 99 91 100*  BUN 12 10 10 10 10   CREATININE 0.61 0.75 0.67 0.73 0.56*  CALCIUM 9.4 9.3 8.9 8.8* 8.8*  MG  --  2.0  --   --   --     GFR: Estimated Creatinine Clearance: 205.6 mL/min (A) (by C-G formula based on SCr of 0.56 mg/dL (L)).   Coagulation Profile: Recent Labs  Lab 01/21/20 2136  INR 0.9    CBG: No results for input(s): GLUCAP in the last 168 hours.    Recent Results (from the past 240 hour(s))  Resp Panel by RT-PCR (Flu A&B, Covid) Nasopharyngeal Swab     Status: None   Collection Time: 01/22/20  5:37 AM   Specimen: Nasopharyngeal Swab; Nasopharyngeal(NP) swabs in vial transport medium  Result Value Ref Range Status   SARS Coronavirus 2 by RT PCR NEGATIVE NEGATIVE Final    Comment: (NOTE) SARS-CoV-2 target nucleic acids are NOT DETECTED.  The SARS-CoV-2 RNA is generally detectable in upper respiratory specimens during the acute phase of infection. The lowest concentration of SARS-CoV-2 viral copies this assay can detect is 138 copies/mL. A negative result does not preclude SARS-Cov-2 infection and should not be used as the sole basis for treatment or other patient management decisions. A negative result may occur with  improper specimen collection/handling, submission of specimen other than nasopharyngeal swab, presence of viral mutation(s) within the areas targeted by this assay, and inadequate number of viral copies(<138 copies/mL). A negative result must be combined with clinical observations, patient history, and epidemiological information. The expected result is Negative.  Fact Sheet for Patients:  BloggerCourse.comhttps://www.fda.gov/media/152166/download  Fact Sheet for Healthcare Providers:  SeriousBroker.ithttps://www.fda.gov/media/152162/download  This test is no t yet approved or cleared by the [place] FDA and  has been authorized for detection  and/or diagnosis of SARS-CoV-2 by FDA under an Emergency Use Authorization (EUA). This EUA will remain  in effect (meaning this test can be used) for the duration of the COVID-19 declaration under Section 564(b)(1) of the Act, 21 U.S.C.section 360bbb-3(b)(1), unless the authorization is terminated  or revoked sooner.       Influenza A by PCR NEGATIVE NEGATIVE Final   Influenza B by PCR NEGATIVE NEGATIVE Final    Comment: (NOTE) The Xpert Xpress SARS-CoV-2/FLU/RSV plus assay is intended as an aid in the diagnosis of influenza from Nasopharyngeal swab specimens and should not be used as a sole basis for treatment. Nasal washings and aspirates are unacceptable for Xpert Xpress SARS-CoV-2/FLU/RSV testing.  Fact Sheet for Patients: BloggerCourse.comhttps://www.fda.gov/media/152166/download  Fact Sheet for Healthcare Providers: SeriousBroker.ithttps://www.fda.gov/media/152162/download  This test is not yet approved or cleared by the [place] FDA and has been authorized for detection and/or diagnosis of SARS-CoV-2 by FDA under an Emergency Use Authorization (EUA). This EUA will remain in effect (meaning this test can be used) for the duration of the COVID-19 declaration under Section 564(b)(1) of the Act, 21 U.S.C. section 360bbb-3(b)(1), unless the authorization is terminated or revoked.  Performed at [facility], [address] [place], [place] [address]   Culture, blood (Routine X 2) w Reflex to ID Panel     Status: None (Preliminary result)   Collection Time: 01/23/20  3:02 PM   Specimen: BLOOD  Result Value Ref Range Status   Specimen Description BLOOD LEFT ANTECUBITAL  Final   Special Requests   Final    BOTTLES DRAWN AEROBIC AND ANAEROBIC Blood Culture results may not be optimal due to an excessive volume of blood received in culture bottles   Culture   Final    NO GROWTH < 24 HOURS Performed at [facility], [address] [place], [place] [address]    Report Status PENDING   Incomplete  Culture, blood (Routine X 2) w Reflex to ID Panel     Status: None (Preliminary result)   Collection Time: 01/23/20  3:12 PM   Specimen: BLOOD  Result Value Ref Range Status   Specimen Description BLOOD BLOOD RIGHT HAND  Final   Special Requests   Final    BOTTLES DRAWN AEROBIC AND ANAEROBIC Blood Culture adequate volume   Culture   Final    NO GROWTH < 24 HOURS Performed  at [facility], [address], [place], [place] [address]    Report Status PENDING  Incomplete      Radiology Studies: DG Chest 2 View  Result Date: 01/24/2020 CLINICAL DATA:  Chest pain. EXAM: CHEST - 2 VIEW COMPARISON:  January 21, 2020. FINDINGS: Stable cardiomediastinal silhouette. Stable stimulator generator seen over the upper chest. No pneumothorax is noted. Left lung is clear. Elevated right hemidiaphragm is noted. New right basilar opacity is noted concerning for right middle lobe and or lower lobe pneumonia with associated effusion. Bony thorax is unremarkable. IMPRESSION: New right basilar opacity is noted concerning for right middle lobe and/or lower lobe pneumonia with associated effusion. Electronically Signed   By: Lupita Raider M.D.   On: 01/24/2020 13:47   CT HEAD WO CONTRAST  Result Date: 01/24/2020 CLINICAL DATA:  19 year old male with altered mental status, recent neurostimulator placement at outside hospital. Acute pulmonary embolus. EXAM: CT HEAD WITHOUT CONTRAST TECHNIQUE: Contiguous axial images were obtained from the base of the skull through the vertex without intravenous contrast. COMPARISON:  Head CT 01/16/2020, brain MRI 11/29/2006. FINDINGS: Brain: Vertex approach bilateral deep brain stimulator electrodes appear stable in configuration from earlier this month. Hypodensity in the superior frontal lobes along the course of the electrodes has regressed, minimal residual on the right. No superimposed No midline shift, ventriculomegaly, mass effect, evidence of mass lesion,  intracranial hemorrhage or evidence of cortically based acute infarction. Stable gray-white matter differentiation elsewhere. Vascular: No suspicious intracranial vascular hyperdensity. Skull: Stable superior bifrontal burr holes. No acute osseous abnormality identified. Sinuses/Orbits: Visualized paranasal sinuses and mastoids are clear. Other: No acute orbit or scalp soft tissue finding. Left side scalp electrodes continuing inferiorly appear stable. IMPRESSION: 1. Largely resolved frontal lobe edema along the course of the deep brain stimulator leads since 01/16/2020. No adverse hardware features. 2. Otherwise negative, no new intracranial abnormality. Electronically Signed   By: Odessa Fleming M.D.   On: 01/24/2020 15:22       LOS: 3 days   Kyann Heydt Rito Ehrlich  [facility] Pager on [email or web address]  01/25/2020, 10:57 AM

## 2020-01-25 NOTE — Plan of Care (Signed)

## 2020-01-25 NOTE — Plan of Care (Signed)
  Problem: Activity: Goal: Risk for activity intolerance will decrease Outcome: Progressing   Problem: Elimination: Goal: Will not experience complications related to urinary retention Outcome: Progressing   

## 2020-01-26 LAB — CBC
HCT: 31.6 % — ABNORMAL LOW (ref 39.0–52.0)
Hemoglobin: 10.3 g/dL — ABNORMAL LOW (ref 13.0–17.0)
MCH: 28.7 pg (ref 26.0–34.0)
MCHC: 32.6 g/dL (ref 30.0–36.0)
MCV: 88 fL (ref 80.0–100.0)
Platelets: 314 10*3/uL (ref 150–400)
RBC: 3.59 MIL/uL — ABNORMAL LOW (ref 4.22–5.81)
RDW: 12 % (ref 11.5–15.5)
WBC: 6.9 10*3/uL (ref 4.0–10.5)
nRBC: 0 % (ref 0.0–0.2)

## 2020-01-26 LAB — COMPREHENSIVE METABOLIC PANEL
ALT: 36 U/L (ref 0–44)
AST: 27 U/L (ref 15–41)
Albumin: 2.7 g/dL — ABNORMAL LOW (ref 3.5–5.0)
Alkaline Phosphatase: 83 U/L (ref 38–126)
Anion gap: 13 (ref 5–15)
BUN: 7 mg/dL (ref 6–20)
CO2: 28 mmol/L (ref 22–32)
Calcium: 8.8 mg/dL — ABNORMAL LOW (ref 8.9–10.3)
Chloride: 99 mmol/L (ref 98–111)
Creatinine, Ser: 0.61 mg/dL (ref 0.61–1.24)
GFR, Estimated: >60 mL/min (ref >60)
Glucose, Bld: 86 mg/dL (ref 70–99)
Potassium: 3.8 mmol/L (ref 3.5–5.1)
Sodium: 140 mmol/L (ref 135–145)
Total Bilirubin: 0.7 mg/dL (ref 0.3–1.2)
Total Protein: 7.3 g/dL (ref 6.5–8.1)

## 2020-01-26 MED ORDER — DOXYCYCLINE HYCLATE 100 MG PO TABS: 100.0000 mg | ORAL_TABLET | Freq: Two times a day (BID) | ORAL | 0 refills | Status: AC

## 2020-01-26 MED ORDER — DOXYCYCLINE HYCLATE 100 MG PO TABS: 100.0000 mg | ORAL_TABLET | Freq: Two times a day (BID) | ORAL | Status: DC

## 2020-01-26 MED ORDER — HYDROCODONE-ACETAMINOPHEN 5-325 MG PO TABS: 1.0000 | ORAL_TABLET | Freq: Four times a day (QID) | ORAL | 0 refills | Status: AC | PRN

## 2020-01-26 NOTE — Progress Notes (Signed)
DISCHARGE NOTE HOME BRUNO LEACH to be discharged Home per MD order. Discussed prescriptions and follow up appointments with the patient. Prescriptions given to patient; medication list explained in detail. Patient verbalized understanding.  Skin clean, dry and intact without evidence of skin break down, no evidence of skin tears noted. IV catheter discontinued intact. Site without signs and symptoms of complications. Dressing and pressure applied. Pt denies pain at the site currently. No complaints noted.  Patient free of lines, drains, and wounds.   An After Visit Summary (AVS) was printed and given to the patient and patient's mother. Seven doses of home medication and bottle from pharmacy given to mother. Patient escorted via wheelchair, and discharged home via private auto.  Velia Meyer, RN

## 2020-01-26 NOTE — Progress Notes (Signed)
Gave patient's mother 7 doses of home medication from floor and bottle of remaining doses from pharmacy.

## 2020-01-26 NOTE — Discharge Summary (Signed)
[facility]  Physician Discharge Summary   Patient ID: Joe Thomas MRN: [id number] DOB/AGE: 2000-09-11 19 y.o.  Admit date: 01/21/2020 Discharge date: 01/26/2020  PCP: Pcp, No  DISCHARGE DIAGNOSES:  Acute pulmonary embolism Right lower lobe pneumonia History of epilepsy Normocytic anemia  RECOMMENDATIONS FOR OUTPATIENT FOLLOW UP: 1. Follow-up with neurology and neurosurgery at [facility] health 2. Consider outpatient hypercoagulable work-up in the next few weeks 3. Mother told that patient needs to establish with a primary care provider.  She will work on it.    Home Health: None Equipment/Devices: None  CODE STATUS: Full code  DISCHARGE CONDITION: fair  Diet recommendation: As before  INITIAL HISTORY: 19 year old male with past medical history of epilepsy presented with right-sided chest and back pain.  Was found to have acute pulmonary embolism.  Was hospitalized for further management.  Recently was admitted at [facility] health for neuro stimulator placement.   HOSPITAL COURSE:   Acute pulmonary embolism/acute respiratory failure with hypoxia Etiology unclear but patient was recently hospitalized for neurostimulator placement.  This was at [facility].  Could be the precipitant reason for his pulmonary embolism.  May benefit from hypercoagulable work-up in the outpatient setting. Lower extremity Doppler study without DVT.  Echocardiogram shows normal systolic function.  No evidence for heart strain. Patient was started on apixaban which is being continued.  Patient had significant pleuritic pain which seems to be improving.    Initially required oxygen.  Has been weaned off today.  Fever/right lower lobe pneumonia Patient noted to have fevers.  Initially thought to be due to his venous thromboembolism.  However fever was persistent.  He started having cough.  Chest x-ray was done which raised concern for pneumonia in the right lung.  The  finding could be pulmonary infarction but due to persistent fever patient was started on doxycycline.    Seems to be improving.  Fever trend improved.  WBC has remained normal.  UA did not suggest infection.  Blood cultures negative so far.  No erythema noted at his surgical site on his head.  CT head did not suggest any intracranial infection.  Will be prescribed doxycycline for another 5 days.    History of epilepsy/acute metabolic encephalopathy Followed by neurology at [facility].  Had a neurostimulator placed recently.  On multiple antiepileptics.  Seizure precautions.   Patient was very somnolent here in the hospital.  A CT head was done which did not show any acute findings.  Valproic acid level was noted to be therapeutic.  Appears to have resolved.  Patient has been awake for the last 24 hours.  Has been ambulating without difficulty.  Somnolence could have been due to fever pain medications and acute illness.  No seizure activity noted.  Outpatient follow-up with his providers at [facility].  Discussed with his mother.  Normocytic anemia Mild drop in hemoglobin is dilutional.  No evidence of overt bleeding.  Obesity Estimated body mass index is 34.93 kg/m as calculated from the following:   Height as of this encounter: 6\' 2"  (1.88 m).   Weight as of this encounter: 123.4 kg.   Overall stable.  Okay for discharge home today.    PERTINENT LABS:  The results of significant diagnostics from this hospitalization (including imaging, microbiology, ancillary and laboratory) are listed below for reference.    Microbiology: Recent Results (from the past 240 hour(s))  Resp Panel by RT-PCR (Flu A&B, Covid) Nasopharyngeal Swab     Status: None  Collection Time: 01/22/20  5:37 AM   Specimen: Nasopharyngeal Swab; Nasopharyngeal(NP) swabs in vial transport medium  Result Value Ref Range Status   SARS Coronavirus 2 by RT PCR NEGATIVE NEGATIVE Final    Comment: (NOTE) SARS-CoV-2  target nucleic acids are NOT DETECTED.  The SARS-CoV-2 RNA is generally detectable in upper respiratory specimens during the acute phase of infection. The lowest concentration of SARS-CoV-2 viral copies this assay can detect is 138 copies/mL. A negative result does not preclude SARS-Cov-2 infection and should not be used as the sole basis for treatment or other patient management decisions. A negative result may occur with  improper specimen collection/handling, submission of specimen other than nasopharyngeal swab, presence of viral mutation(s) within the areas targeted by this assay, and inadequate number of viral copies(<138 copies/mL). A negative result must be combined with clinical observations, patient history, and epidemiological information. The expected result is Negative.  Fact Sheet for Patients:  BloggerCourse.com  Fact Sheet for Healthcare Providers:  SeriousBroker.it  This test is no t yet approved or cleared by the [place] FDA and  has been authorized for detection and/or diagnosis of SARS-CoV-2 by FDA under an Emergency Use Authorization (EUA). This EUA will remain  in effect (meaning this test can be used) for the duration of the COVID-19 declaration under Section 564(b)(1) of the Act, 21 U.S.C.section 360bbb-3(b)(1), unless the authorization is terminated  or revoked sooner.       Influenza A by PCR NEGATIVE NEGATIVE Final   Influenza B by PCR NEGATIVE NEGATIVE Final    Comment: (NOTE) The Xpert Xpress SARS-CoV-2/FLU/RSV plus assay is intended as an aid in the diagnosis of influenza from Nasopharyngeal swab specimens and should not be used as a sole basis for treatment. Nasal washings and aspirates are unacceptable for Xpert Xpress SARS-CoV-2/FLU/RSV testing.  Fact Sheet for Patients: BloggerCourse.com  Fact Sheet for Healthcare  Providers: SeriousBroker.it  This test is not yet approved or cleared by the [place] FDA and has been authorized for detection and/or diagnosis of SARS-CoV-2 by FDA under an Emergency Use Authorization (EUA). This EUA will remain in effect (meaning this test can be used) for the duration of the COVID-19 declaration under Section 564(b)(1) of the Act, 21 U.S.C. section 360bbb-3(b)(1), unless the authorization is terminated or revoked.  Performed at [facility], [address], [place], [place] [address]   Culture, blood (Routine X 2) w Reflex to ID Panel     Status: None (Preliminary result)   Collection Time: 01/23/20  3:02 PM   Specimen: BLOOD  Result Value Ref Range Status   Specimen Description BLOOD LEFT ANTECUBITAL  Final   Special Requests   Final    BOTTLES DRAWN AEROBIC AND ANAEROBIC Blood Culture results may not be optimal due to an excessive volume of blood received in culture bottles   Culture   Final    NO GROWTH 2 DAYS Performed at [facility], [address], [place], [place] [address]    Report Status PENDING  Incomplete  Culture, blood (Routine X 2) w Reflex to ID Panel     Status: None (Preliminary result)   Collection Time: 01/23/20  3:12 PM   Specimen: BLOOD  Result Value Ref Range Status   Specimen Description BLOOD BLOOD RIGHT HAND  Final   Special Requests   Final    BOTTLES DRAWN AEROBIC AND ANAEROBIC Blood Culture adequate volume   Culture   Final    NO GROWTH 2  DAYS Performed at [facility], [address], [place], [place] [address]    Report Status PENDING  Incomplete     Labs:  COVID-19 Labs   Lab Results  Component Value Date   SARSCOV2NAA NEGATIVE 01/22/2020      Basic Metabolic Panel: Recent Labs  Lab 01/22/20 1016 01/23/20 0500 01/24/20 0222 01/25/20 0713 01/26/20 0246  NA 139 139 139 139 140  K 4.2 4.0 4.0 3.8 3.8  CL 99 100 99 99 99  CO2 GLUCOSE 135* 99  91 100* 86  BUN CREATININE 0.75 0.67 0.73 0.56* 0.61  CALCIUM 9.3 8.9 8.8* 8.8* 8.8*  MG 2.0  --   --   --   --    Liver Function Tests: Recent Labs  Lab 01/24/20 0222 01/25/20 0713 01/26/20 0246  AST 38 26 27  ALT 38 38 36  ALKPHOS 69 80 83  BILITOT 1.2 0.7 0.7  PROT 6.7 7.0 7.3  ALBUMIN 2.8* 2.7* 2.7*   CBC: Recent Labs  Lab 01/22/20 1016 01/23/20 0500 01/24/20 0222 01/25/20 0713 01/26/20 0246  WBC 10.2 9.6 8.4 6.9 6.9  HGB 12.9* 11.1* 10.6* 10.0* 10.3*  HCT 37.6* 33.7* 31.0* 30.3* 31.6*  MCV 86.4 87.1 86.4 87.8 88.0  PLT 289 230 233 269 314     IMAGING STUDIES DG Chest 2 View  Result Date: 01/24/2020 CLINICAL DATA:  Chest pain. EXAM: CHEST - 2 VIEW COMPARISON:  January 21, 2020. FINDINGS: Stable cardiomediastinal silhouette. Stable stimulator generator seen over the upper chest. No pneumothorax is noted. Left lung is clear. Elevated right hemidiaphragm is noted. New right basilar opacity is noted concerning for right middle lobe and or lower lobe pneumonia with associated effusion. Bony thorax is unremarkable. IMPRESSION: New right basilar opacity is noted concerning for right middle lobe and/or lower lobe pneumonia with associated effusion. Electronically Signed   By: Lupita Raider M.D.   On: 01/24/2020 13:47   DG Chest 2 View  Result Date: 01/21/2020 CLINICAL DATA:  Chest pain EXAM: CHEST - 2 VIEW COMPARISON:  None. FINDINGS: Stimulator generator over the lower chest. No focal opacity or pleural effusion. Normal cardiomediastinal silhouette. No pneumothorax. IMPRESSION: No active cardiopulmonary disease. Electronically Signed   By: Jasmine Pang M.D.   On: 01/21/2020 21:43   CT HEAD WO CONTRAST  Result Date: 01/24/2020 CLINICAL DATA:  19 year old male with altered mental status, recent neurostimulator placement at outside hospital. Acute pulmonary embolus. EXAM: CT HEAD WITHOUT CONTRAST TECHNIQUE: Contiguous axial images were obtained from the  base of the skull through the vertex without intravenous contrast. COMPARISON:  Head CT 01/16/2020, brain MRI 11/29/2006. FINDINGS: Brain: Vertex approach bilateral deep brain stimulator electrodes appear stable in configuration from earlier this month. Hypodensity in the superior frontal lobes along the course of the electrodes has regressed, minimal residual on the right. No superimposed No midline shift, ventriculomegaly, mass effect, evidence of mass lesion, intracranial hemorrhage or evidence of cortically based acute infarction. Stable gray-white matter differentiation elsewhere. Vascular: No suspicious intracranial vascular hyperdensity. Skull: Stable superior bifrontal burr holes. No acute osseous abnormality identified. Sinuses/Orbits: Visualized paranasal sinuses and mastoids are clear. Other: No acute orbit or scalp soft tissue finding. Left side scalp electrodes continuing inferiorly appear stable. IMPRESSION: 1. Largely resolved frontal lobe edema along the course of the deep brain stimulator leads since 01/16/2020. No adverse hardware features. 2. Otherwise negative, no new intracranial abnormality. Electronically Signed  By: Odessa Fleming M.D.   On: 01/24/2020 15:22   CT Head Wo Contrast  Result Date: 01/17/2020 CLINICAL DATA:  Head trauma, seizure EXAM: CT HEAD WITHOUT CONTRAST TECHNIQUE: Contiguous axial images were obtained from the base of the skull through the vertex without intravenous contrast. COMPARISON:  None. FINDINGS: Brain: Deep brain stimulator leads are noted. No hemorrhage along the path. There is mild edema within both frontal lobes along the course of the leads. No midline shift or other mass effect. Vascular: No hyperdense vessel or unexpected calcification. Skull: Bifrontal burr holes. Sinuses/Orbits: No acute finding. Other: None. IMPRESSION: 1. Mild edema within both frontal lobes along the course of the deep brain stimulator leads. No hemorrhage along the path. 2. No midline  shift or other mass effect. Electronically Signed   By: Deatra Robinson M.D.   On: 01/17/2020 00:06   CT Angio Chest PE W and/or Wo Contrast  Result Date: 01/22/2020 CLINICAL DATA:  Shortness of breath EXAM: CT ANGIOGRAPHY CHEST WITH CONTRAST TECHNIQUE: Multidetector CT imaging of the chest was performed using the standard protocol during bolus administration of intravenous contrast. Multiplanar CT image reconstructions and MIPs were obtained to evaluate the vascular anatomy. CONTRAST:  OMNIPAQUE IOHEXOL 350 MG/ML SOLN COMPARISON:  None. FINDINGS: Cardiovascular: There is a optimal opacification of the pulmonary arteries. No central filling defects are seen. There is nearly occlusive thrombus in the posterior right lower lobe segmental and subsegmental arterial branches. The heart is normal in size. No pericardial effusion or thickening. No evidence right heart strain. There is normal three-vessel brachiocephalic anatomy without proximal stenosis. The thoracic aorta is normal in appearance. Mediastinum/Nodes: No hilar, mediastinal, or axillary adenopathy. Thyroid gland, trachea, and esophagus demonstrate no significant findings. Lungs/Pleura: Rounded patchy airspace opacity seen at the posterior right lung base. No pleural effusion or pneumothorax is seen. Upper Abdomen: No acute abnormalities present in the visualized portions of the upper abdomen. Musculoskeletal: No chest wall abnormality. No acute or significant osseous findings. Review of the MIP images confirms the above findings. IMPRESSION: Nearly occlusive thrombus in the posterior right lower lobe segmental and subsegmental arterial branches. There is adjacent airspace opacities which could be due to atelectasis or pulmonary infarction. No evidence of right ventricular heart strain. Electronically Signed   By: Jonna Clark M.D.   On: 01/22/2020 03:02   ECHOCARDIOGRAM COMPLETE  Result Date: 01/22/2020    ECHOCARDIOGRAM REPORT   Patient Name:    JEROMIAH OHALLORAN [facility] Date of Exam: 01/22/2020 Medical Rec #:  [id number]        Height:       74.0 in Accession #:    [id number]       Weight:       275.6 lb Date of Birth:  October 15, 2000        BSA:          2.491 m Patient Age:    19 years         BP:           115/92 mmHg Patient Gender: M                HR:           92 bpm. Exam Location:  Inpatient Procedure: 2D Echo Indications:    pulmonary embolus 415.19  History:        Patient has no prior history of Echocardiogram examinations.  Sonographer:    Delcie Roch Referring Phys: [id number] Eann Cleland IMPRESSIONS  1. Left  ventricular ejection fraction, by estimation, is 60 to 65%. The left ventricle has normal function. The left ventricle has no regional wall motion abnormalities. Left ventricular diastolic parameters were normal.  2. Right ventricular systolic function is normal. The right ventricular size is normal.  3. The mitral valve is normal in structure. No evidence of mitral valve regurgitation. No evidence of mitral stenosis.  4. The aortic valve is normal in structure. Aortic valve regurgitation is not visualized. No aortic stenosis is present.  5. The inferior vena cava is normal in size with greater than 50% respiratory variability, suggesting right atrial pressure of 3 mmHg. FINDINGS  Left Ventricle: Left ventricular ejection fraction, by estimation, is 60 to 65%. The left ventricle has normal function. The left ventricle has no regional wall motion abnormalities. The left ventricular internal cavity size was normal in size. There is  no left ventricular hypertrophy. Left ventricular diastolic parameters were normal. Right Ventricle: The right ventricular size is normal. No increase in right ventricular wall thickness. Right ventricular systolic function is normal. Left Atrium: Left atrial size was normal in size. Right Atrium: Right atrial size was normal in size. Pericardium: There is no evidence of pericardial effusion. Mitral Valve: The mitral  valve is normal in structure. No evidence of mitral valve regurgitation. No evidence of mitral valve stenosis. Tricuspid Valve: The tricuspid valve is normal in structure. Tricuspid valve regurgitation is not demonstrated. No evidence of tricuspid stenosis. Aortic Valve: The aortic valve is normal in structure. Aortic valve regurgitation is not visualized. No aortic stenosis is present. Pulmonic Valve: The pulmonic valve was normal in structure. Pulmonic valve regurgitation is not visualized. No evidence of pulmonic stenosis. Aorta: The aortic root is normal in size and structure. Venous: The inferior vena cava is normal in size with greater than 50% respiratory variability, suggesting right atrial pressure of 3 mmHg. IAS/Shunts: No atrial level shunt detected by color flow Doppler.  LEFT VENTRICLE PLAX 2D LVIDd:         4.30 cm Diastology LVIDs:         2.80 cm LV e' medial:    8.92 cm/s LV PW:         1.00 cm LV E/e' medial:  10.6 LV IVS:        1.00 cm LV e' lateral:   17.20 cm/s                        LV E/e' lateral: 5.5  RIGHT VENTRICLE RV S prime:     15.00 cm/s TAPSE (M-mode): 2.1 cm LEFT ATRIUM             Index       RIGHT ATRIUM           Index LA diam:        3.50 cm 1.41 cm/m  RA Area:     12.70 cm LA Vol (A2C):   44.7 ml 17.95 ml/m RA Volume:   26.20 ml  10.52 ml/m LA Vol (A4C):   37.5 ml 15.06 ml/m LA Biplane Vol: 42.6 ml 17.10 ml/m  AORTIC VALVE LVOT Vmax:   162.00 cm/s LVOT Vmean:  117.000 cm/s LVOT VTI:    0.275 m  AORTA Ao Asc diam: 3.20 cm MITRAL VALVE MV Area (PHT): 4.21 cm    SHUNTS MV Decel Time: 180 msec    Systemic VTI: 0.28 m MV E velocity: 94.30 cm/s MV A velocity: 65.30 cm/s MV E/A ratio:  1.44  Donato Schultz MD Electronically signed by Donato Schultz MD Signature Date/Time: 01/22/2020/3:55:44 PM    Final    VAS [place] LOWER EXTREMITY VENOUS (DVT)  Result Date: 01/24/2020  Lower Venous DVT Study Indications: SOB, Swelling, and pulmonary embolism.  Comparison Study: No previous exam  Performing Technologist: Clint Guy  Examination Guidelines: A complete evaluation includes B-mode imaging, spectral Doppler, color Doppler, and power Doppler as needed of all accessible portions of each vessel. Bilateral testing is considered an integral part of a complete examination. Limited examinations for reoccurring indications may be performed as noted. The reflux portion of the exam is performed with the patient in reverse Trendelenburg.  +---------+---------------+---------+-----------+----------+--------------+ RIGHT    CompressibilityPhasicitySpontaneityPropertiesThrombus Aging +---------+---------------+---------+-----------+----------+--------------+ CFV      Full           Yes      Yes                                 +---------+---------------+---------+-----------+----------+--------------+ SFJ      Full                                                        +---------+---------------+---------+-----------+----------+--------------+ FV Prox  Full                                                        +---------+---------------+---------+-----------+----------+--------------+ FV Mid   Full                                                        +---------+---------------+---------+-----------+----------+--------------+ FV DistalFull                                                        +---------+---------------+---------+-----------+----------+--------------+ PFV      Full                                                        +---------+---------------+---------+-----------+----------+--------------+ POP      Full           Yes      Yes                                 +---------+---------------+---------+-----------+----------+--------------+ PTV      Full                                                        +---------+---------------+---------+-----------+----------+--------------+  PERO     Full                                                         +---------+---------------+---------+-----------+----------+--------------+   +---------+---------------+---------+-----------+----------+--------------+ LEFT     CompressibilityPhasicitySpontaneityPropertiesThrombus Aging +---------+---------------+---------+-----------+----------+--------------+ CFV      Full           Yes      Yes                                 +---------+---------------+---------+-----------+----------+--------------+ SFJ      Full                                                        +---------+---------------+---------+-----------+----------+--------------+ FV Prox  Full                                                        +---------+---------------+---------+-----------+----------+--------------+ FV Mid   Full                                                        +---------+---------------+---------+-----------+----------+--------------+ FV DistalFull                                                        +---------+---------------+---------+-----------+----------+--------------+ PFV      Full                                                        +---------+---------------+---------+-----------+----------+--------------+ POP      Full           Yes      Yes                                 +---------+---------------+---------+-----------+----------+--------------+ PTV      Full                                                        +---------+---------------+---------+-----------+----------+--------------+ PERO     Full                                                        +---------+---------------+---------+-----------+----------+--------------+  Summary: RIGHT: - There is no evidence of deep vein thrombosis in the lower extremity.  - No cystic structure found in the popliteal fossa.  LEFT: - There is no evidence of deep vein thrombosis in the lower extremity.  - No cystic structure found  in the popliteal fossa.  *See table(s) above for measurements and observations. Electronically signed by Heath Lark on 01/24/2020 at 1:09:48 PM.    Final     DISCHARGE EXAMINATION: Vitals:   01/25/20 1707 01/25/20 2058 01/26/20 0528 01/26/20 0921  BP: 124/84 108/62 118/61 106/64  Pulse: 98 85 92 85  Resp: 19 17 18 18   Temp: (!) 100.5 F (38.1 C) 99.8 F (37.7 C) (!) 100.6 F (38.1 C) 99.5 F (37.5 C)  TempSrc: Oral Oral Oral Oral  SpO2: 98% 92% 90% 92%  Weight:      Height:       General appearance: Awake alert.  In no distress Resp: Clear to auscultation bilaterally.  Normal effort Cardio: S1-S2 is normal regular.  No S3-S4.  No rubs murmurs or bruit GI: Abdomen is soft.  Nontender nondistended.  Bowel sounds are present normal.  No masses organomegaly    DISPOSITION: Home with mother  Discharge Instructions    Call MD for:  difficulty breathing, headache or visual disturbances   Complete by: As directed    Call MD for:  extreme fatigue   Complete by: As directed    Call MD for:  persistant dizziness or light-headedness   Complete by: As directed    Call MD for:  persistant nausea and vomiting   Complete by: As directed    Call MD for:  redness, tenderness, or signs of infection (pain, swelling, redness, odor or green/yellow discharge around incision site)   Complete by: As directed    Call MD for:  severe uncontrolled pain   Complete by: As directed    Diet - low sodium heart healthy   Complete by: As directed    Discharge instructions   Complete by: As directed    Please be sure to follow-up with his neurologist and neurosurgeon at [facility] as scheduled.  Take medications as prescribed.  Seek attention if chest pain or shortness of breath were to get worsen or if you have any seizure type activity.  Fever will likely take another 24 to 48 hours to completely resolve.  You were cared for by a hospitalist during your hospital stay. If you have any questions about  your discharge medications or the care you received while you were in the hospital after you are discharged, you can call the unit and asked to speak with the hospitalist on call if the hospitalist that took care of you is not available. Once you are discharged, your primary care physician will handle any further medical issues. Please note that NO REFILLS for any discharge medications will be authorized once you are discharged, as it is imperative that you return to your primary care physician (or establish a relationship with a primary care physician if you do not have one) for your aftercare needs so that they can reassess your need for medications and monitor your lab values. If you do not have a primary care physician, you can call [phone] for a physician referral.   Increase activity slowly   Complete by: As directed         Allergies as of 01/26/2020      Reactions   Keppra [levetiracetam] Other (See Comments)   CAUSED  MORE SEIZURES   Penicillins Rash, Other (See Comments)   PATIENT HAS HAD A PCN REACTION WITH IMMEDIATE RASH, FACIAL/TONGUE/THROAT SWELLING, SOB, OR LIGHTHEADEDNESS WITH HYPOTENSION:  #  #  #  YES  #  #  #   Has patient had a PCN reaction causing severe rash involving mucus membranes or skin necrosis: No Has patient had a PCN reaction that required hospitalization: No Has patient had a PCN reaction occurring within the last 10 years: No If all of the above answers are "NO", then may proceed with Cephalosporin use.      Medication List    TAKE these medications   Apixaban Starter Pack (10mg  and 5mg ) Commonly known as: ELIQUIS STARTER PACK Take as directed on package: start with two-5mg  tablets twice daily for 7 days. On day 8, switch to one-5mg  tablet twice daily.   apixaban 5 MG Tabs tablet Commonly known as: ELIQUIS Take 1 tablet (5 mg total) by mouth 2 (two) times daily. PLEASE START ONLY AFTER THE STARTER PACK HAS BEEN COMPLETED. Start taking on: February 29, 2020   Cenobamate 200 MG Tabs Take 200 mg by mouth at bedtime.   clonazePAM 1 MG tablet Commonly known as: KLONOPIN Take 2 mg by mouth daily as needed for anxiety (multiple seizures).   divalproex 500 MG 24 hr tablet Commonly known as: DEPAKOTE ER Take 1,500 mg by mouth at bedtime.   doxycycline 100 MG tablet Commonly known as: VIBRA-TABS Take 1 tablet (100 mg total) by mouth every 12 (twelve) hours for 5 days.   HYDROcodone-acetaminophen 5-325 MG tablet Commonly known as: NORCO/VICODIN Take 1-2 tablets by mouth every 6 (six) hours as needed for severe pain.   lacosamide 200 MG Tabs tablet Commonly known as: VIMPAT Take 400 mg by mouth at bedtime.   Nayzilam 5 MG/0.1ML Soln Generic drug: Midazolam Place 1 each into the nose See admin instructions. Use 1 spray nasally as needed for seizures that presist 5 mins after trying clonazepam   ONE-A-DAY FOR HIM VITACRAVES PO Take 1 tablet by mouth daily.         Follow-up Information    Popli, Felecia Jan, MD Follow up.   Specialty: Pediatric Neurology Why: keep your appointment. call him for any questions Contact information: [address] [place] [address] [phone]               TOTAL DISCHARGE TIME: 35 minutes  Coral Timme Rito Ehrlich  [facility] Pager on [email or web address]  01/26/2020, 11:55 AM

## 2020-01-29 LAB — CULTURE, BLOOD (ROUTINE X 2)
Culture: NO GROWTH
Culture: NO GROWTH
Special Requests: ADEQUATE

## 2021-06-18 ENCOUNTER — Other Ambulatory Visit: Payer: Self-pay

## 2021-06-18 ENCOUNTER — Emergency Department (HOSPITAL_COMMUNITY)
Admission: EM | Admit: 2021-06-18 | Discharge: 2021-06-18 | Disposition: A | Discharge status: Home / Self Care | Payer: 59 | Attending: Emergency Medicine | Admitting: Emergency Medicine

## 2021-06-18 ENCOUNTER — Encounter (HOSPITAL_COMMUNITY): Payer: Self-pay | Admitting: Emergency Medicine

## 2021-06-18 DIAGNOSIS — G40909 Epilepsy, unspecified, not intractable, without status epilepticus: Secondary | ICD-10-CM | POA: Diagnosis not present

## 2021-06-18 DIAGNOSIS — R569 Unspecified convulsions: Secondary | ICD-10-CM | POA: Diagnosis present

## 2021-06-18 DIAGNOSIS — Z7901 Long term (current) use of anticoagulants: Secondary | ICD-10-CM | POA: Insufficient documentation

## 2021-06-18 LAB — BASIC METABOLIC PANEL
Anion gap: 8 (ref 5–15)
BUN: 11 mg/dL (ref 6–20)
CO2: 28 mmol/L (ref 22–32)
Calcium: 9.5 mg/dL (ref 8.9–10.3)
Chloride: 107 mmol/L (ref 98–111)
Creatinine, Ser: 0.53 mg/dL — ABNORMAL LOW (ref 0.61–1.24)
GFR, Estimated: >60 mL/min (ref >60)
Glucose, Bld: 94 mg/dL (ref 70–99)
Potassium: 3.8 mmol/L (ref 3.5–5.1)
Sodium: 143 mmol/L (ref 135–145)

## 2021-06-18 LAB — CBC
HCT: 41.7 % (ref 39.0–52.0)
Hemoglobin: 14.7 g/dL (ref 13.0–17.0)
MCH: 30.1 pg (ref 26.0–34.0)
MCHC: 35.3 g/dL (ref 30.0–36.0)
MCV: 85.5 fL (ref 80.0–100.0)
Platelets: 273 10*3/uL (ref 150–400)
RBC: 4.88 MIL/uL (ref 4.22–5.81)
RDW: 12.3 % (ref 11.5–15.5)
WBC: 6 10*3/uL (ref 4.0–10.5)
nRBC: 0 % (ref 0.0–0.2)

## 2021-06-18 LAB — VALPROIC ACID LEVEL: Valproic Acid Lvl: 94 ug/mL (ref 50.0–100.0)

## 2021-06-18 MED ORDER — CLONAZEPAM 0.5 MG PO TABS
1.0000 mg | ORAL_TABLET | Freq: Once | ORAL | Status: AC
  Administered 2021-06-18: 1 mg via ORAL
  Filled 2021-06-18: qty 2

## 2021-06-18 NOTE — Discharge Instructions (Addendum)
Return for any problem. ? ?Follow-up with Dr. Bertram Savin at [facility] Neurology as instructed. ? ?Please take clonazepam as instructed - 1 mg orally every 8 hours (3 times a day) for the next 24 to 48 hours until you can follow-up with Dr. Bertram Savin.   ? ?This will help decrease the amount of Aura that you are experience. ? ?

## 2021-06-18 NOTE — ED Triage Notes (Signed)
Seizure medications changed approx 5 days ago and pt having 5-7 seizures per day.  States last seizure at 3am.  States he takes Vimpat, Depakote, and a trial medication. ?

## 2021-06-18 NOTE — ED Provider Notes (Signed)
?[facility] EMERGENCY DEPARTMENT ?Provider Note ? ? ?CSN: [id number] ?Arrival date & time: 06/18/21  5397 ? ?  ? ?History ? ?Chief Complaint  ?Patient presents with  ? Seizures  ? ? ?JIYAAN STEINHAUSER is a 21 y.o. male. ? ?21 year old male with prior medical history as detailed below presents for evaluation.  Patient with no known history of epilepsy.  Patient is followed by [facility] neurology - Dr. Bertram Savin. ? ?Patient with history of deep brain stimulator, vagal nerve stimulator.  He is currently taking Vimpat, Depakote, and Xcopri. ? ?Patient reports recent admission at [facility] from 4/24 through 4/28. ? ?During his admission patient's Nuprin stimulator was adjusted.  Patient's Xcopri dose was also increased to 250 mg nightly. ? ?Since discharge, patient reports increased Auras.  He reports that chewing on a towel makes his Auras improve.  He reports 1 full-blown seizure that occurred last night around 3 AM.  This was not observed by his father.  ? ?He reports that taking klonazepam reduces his Aura's.  He last took a dose of clonazepam around 3 AM today. ? ? ? ? ? ?The history is provided by the patient and medical records.  ?Seizures ?Seizure activity on arrival: no   ?Preceding symptoms: aura   ? ?  ? ?Home Medications ?Prior to Admission medications   ?Medication Sig Start Date End Date Taking? Authorizing Provider  ?apixaban (ELIQUIS) 5 MG TABS tablet Take 1 tablet (5 mg total) by mouth 2 (two) times daily. PLEASE START ONLY AFTER THE STARTER PACK HAS BEEN COMPLETED. 02/29/20   Osvaldo Shipper, MD  ?APIXABAN Everlene Balls) VTE STARTER PACK (10MG  AND 5MG ) Take as directed on package: start with two-5mg  tablets twice daily for 7 days. On day 8, switch to one-5mg  tablet twice daily. 01/23/20   , MD  ?Cenobamate 200 MG TABS Take 200 mg by mouth at bedtime. 01/14/20   [provider]  ?clonazePAM (KLONOPIN) 1 MG tablet Take 2 mg by mouth daily as needed for anxiety (multiple seizures).     [provider]  ?divalproex (DEPAKOTE ER) 500 MG 24 hr tablet Take 1,500 mg by mouth at bedtime.    [provider]  ?lacosamide (VIMPAT) 200 MG TABS tablet Take 400 mg by mouth at bedtime.    [provider]  ?Midazolam (NAYZILAM) 5 MG/0.1ML SOLN Place 1 each into the nose See admin instructions. Use 1 spray nasally as needed for seizures that presist 5 mins after trying clonazepam 09/04/19   [provider]  ?Multiple Vitamins-Minerals (ONE-A-DAY FOR HIM VITACRAVES PO) Take 1 tablet by mouth daily.    [provider]  ?   ? ?Allergies    ?Keppra [levetiracetam] and Penicillins   ? ?Review of Systems   ?Review of Systems  ?Neurological:  Positive for seizures.  ?All other systems reviewed and are negative. ? ?Physical Exam ?Updated Vital Signs ?BP 127/90   Pulse 70   Temp 97.8 ?F (36.6 ?C) (Oral)   Resp 14   SpO2 95%  ?Physical Exam ?Vitals and nursing note reviewed.  ?Constitutional:   ?   General: He is not in acute distress. ?   Appearance: Normal appearance. He is well-developed.  ?HENT:  ?   Head: Normocephalic and atraumatic.  ?Eyes:  ?   Conjunctiva/sclera: Conjunctivae normal.  ?   Pupils: Pupils are equal, round, and reactive to light.  ?Cardiovascular:  ?   Rate and Rhythm: Normal rate and regular rhythm.  ?  Heart sounds: Normal heart sounds.  ?Pulmonary:  ?   Effort: Pulmonary effort is normal. No respiratory distress.  ?   Breath sounds: Normal breath sounds.  ?Abdominal:  ?   General: There is no distension.  ?   Palpations: Abdomen is soft.  ?   Tenderness: There is no abdominal tenderness.  ?Musculoskeletal:     ?   General: No deformity. Normal range of motion.  ?   Cervical back: Normal range of motion and neck supple.  ?Skin: ?   General: Skin is warm and dry.  ?Neurological:  ?   General: No focal deficit present.  ?   Mental Status: He is alert and oriented to person, place, and time. Mental status is at baseline.  ? ? ?ED Results / Procedures  / Treatments   ?Labs ?(all labs ordered are listed, but only abnormal results are displayed) ?Labs Reviewed  ?BASIC METABOLIC PANEL - Abnormal; Notable for the following components:  ?    Result Value  ? Creatinine, Ser 0.53 (*)   ? All other components within normal limits  ?VALPROIC ACID LEVEL  ?CBC  ? ? ?EKG ?None ? ?Radiology ?No results found. ? ?Procedures ?Procedures  ? ? ?Medications Ordered in ED ?Medications  ?clonazePAM (KLONOPIN) tablet 1 mg (1 mg Oral Given 06/18/21 1235)  ? ? ?ED Course/ Medical Decision Making/ A&P ?  ?                        ?Medical Decision Making ?Amount and/or Complexity of Data Reviewed ?Labs: ordered. ? ?Risk ?Prescription drug management. ? ? ? ?Medical Screen Complete ? ?This patient presented to the ED with complaint of epilepsy. ? ?This complaint involves an extensive number of treatment options. The initial differential diagnosis includes, but is not limited to, epilepsy ? ?This presentation is: Acute, Chronic, Self-Limited, Previously Undiagnosed, Uncertain Prognosis, Complicated, Systemic Symptoms, and Threat to Life/Bodily Function ? ? ?Patient with longstanding history of epilepsy. ? ?Patient is known to [facility] neurology. ? ?Patient reports increased auras and breakthrough seizures since recent admission at [facility]. ? ?Patient is neurologically intact and comfortable during time of evaluation.  No observed seizure-like activity in the ED. ? ?Obtained screening labs are without significant abnormality.  Depakote level is 94. ? ?Case discussed with neurology service at [facility] -Dr. Rennie Plowman.  ? ?Given patient's complexity close follow-up with Dr. Bertram Savin at [facility] is desired.  Dr. Lajean Silvius recommends use of clonazepam 1 mg 3 times daily until follow-up can be arranged tomorrow with Dr. Bertram Savin in the office. ? ?Patient and patient's father are agreeable with this plan. ? ?Importance of close follow-up is stressed.  Strict return precautions given and understood. ? ? ?Co  morbidities that complicated the patient's evaluation ? ?History of epilepsy ? ? ?Additional history obtained: ? ?Additional history obtained from Father ? ?External records from outside sources obtained and reviewed including prior ED visits and prior Inpatient records.  ? ? ?Lab Tests: ? ?I ordered and personally interpreted labs.  The pertinent results include:  CBC, BMP, Valproic ? ?Cardiac Monitoring: ? ?The patient was maintained on a cardiac monitor.  I personally viewed and interpreted the cardiac monitor which showed an underlying rhythm of: NSR ? ? ?Medicines ordered: ? ?I ordered medication including clonazepam  for seizure/Aura  ?Reevaluation of the patient after these medicines showed that the patient: improved ? ?Problem List / ED Course: ? ?Epilepsy ? ? ?Reevaluation: ? ?After the interventions  noted above, I reevaluated the patient and found that they have: improved ? ? ?Disposition: ? ?After consideration of the diagnostic results and the patients response to treatment, I feel that the patent would benefit from close outpatient followup.  ? ? ? ? ? ? ? ? ?Final Clinical Impression(s) / ED Diagnoses ?Final diagnoses:  ?Nonintractable epilepsy without status epilepticus, unspecified epilepsy type (HCC)  ? ? ?Rx / DC Orders ?ED Discharge Orders   ? ? None  ? ?  ? ? ?  ?Wynetta FinesMessick, Shamia Uppal C, MD ?06/18/21 1312 ? ?

## 2021-08-06 IMAGING — CT CT HEAD W/O CM
4 of 5 series · 15 of 47 positions shown, 17 images · non-contrast
Comparison: Head CT 01/16/2020, brain MRI 11/29/2006.

CLINICAL DATA: 19-year-old male with altered mental status, recent
neurostimulator placement at outside hospital. Acute pulmonary
embolus.

EXAM:
CT HEAD WITHOUT CONTRAST
TECHNIQUE: Contiguous axial images were obtained from the base of the skull
through the vertex without intravenous contrast.

[Series 3: head without · axial · non-contrast · 0.51mm/px · z∈[-120,-10]mm · 4 of 38 slices shown]
[im 8/38  brain]
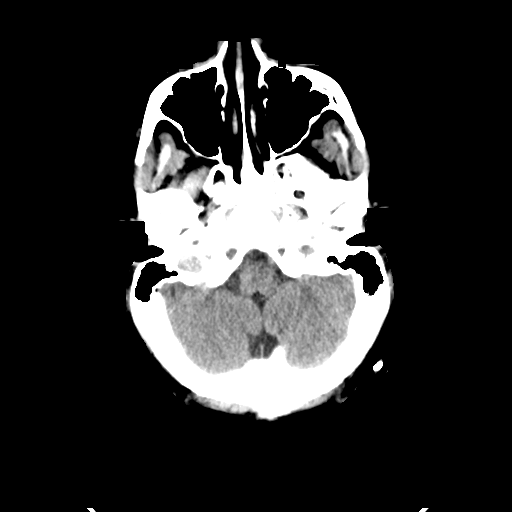
[im 15/38  brain]
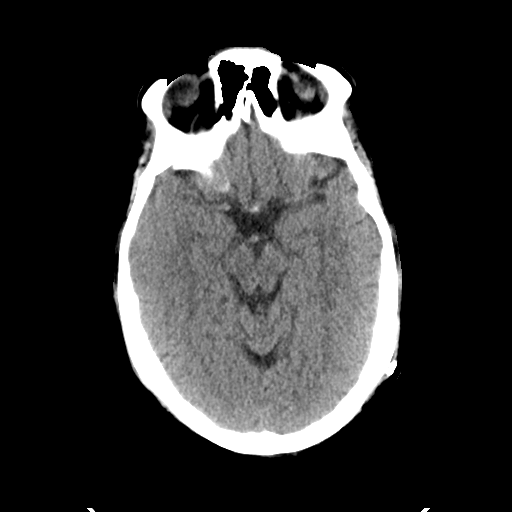
[im 23/38  brain]
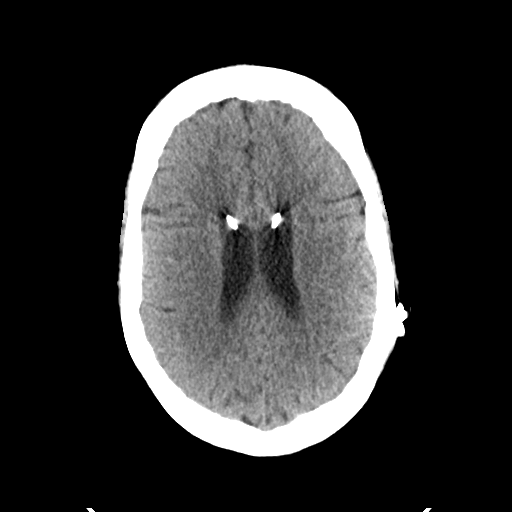
[im 30/38  brain]
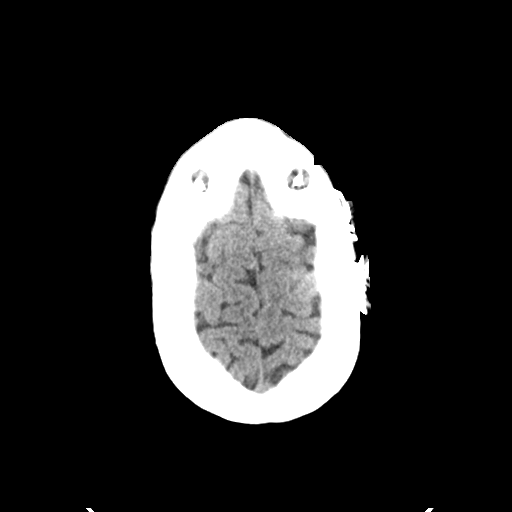

[Series 5: head without cor · coronal · non-contrast · 0.36mm/px · 3 of 80 slices shown]
[im 27/80  brain]
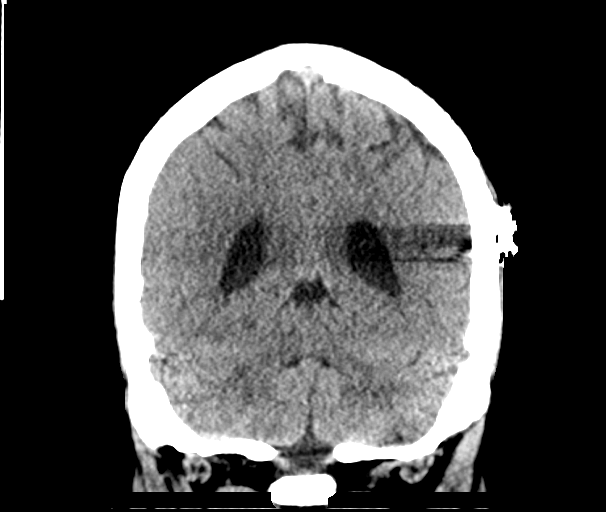
[im 36/80  brain]
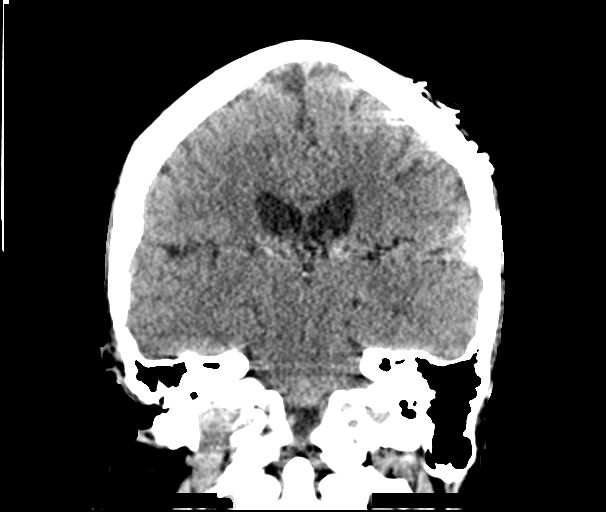
[im 44/80  brain]
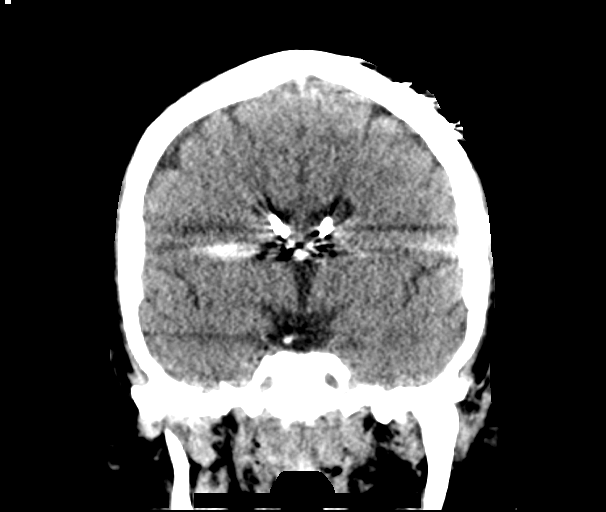

[Series 6: head without sag · sagittal · non-contrast · 0.36mm/px · 3 of 60 slices shown]
[im 20/60  brain]
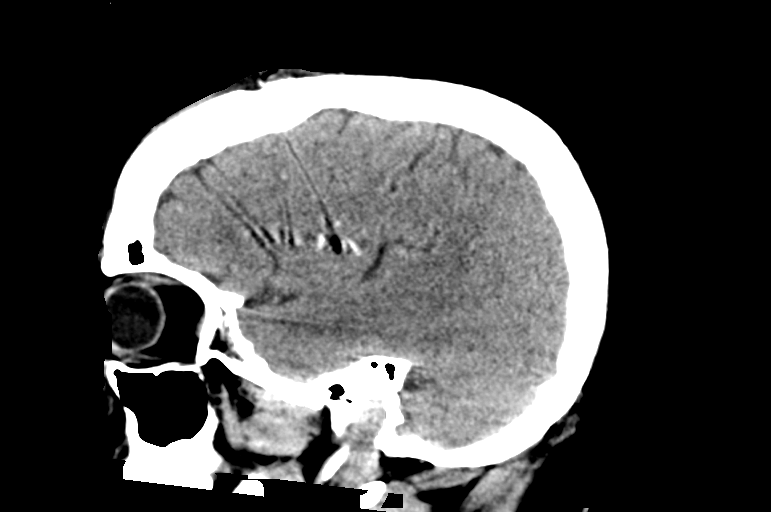
[im 30/60  brain]
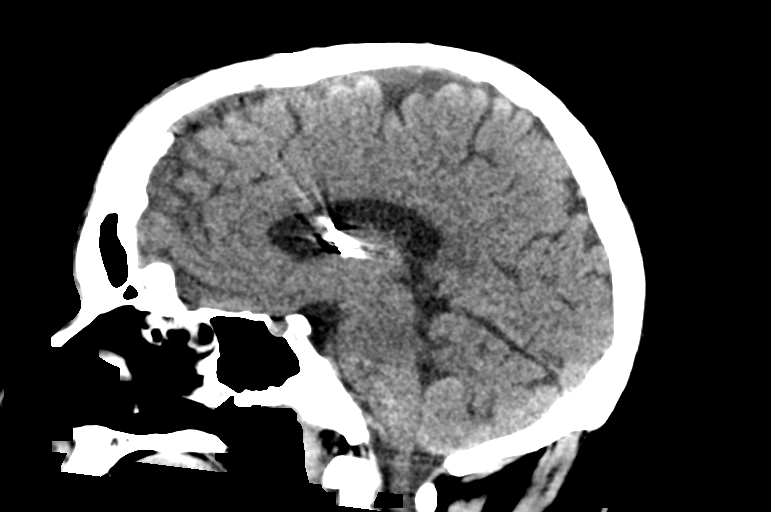
[im 40/60  brain]
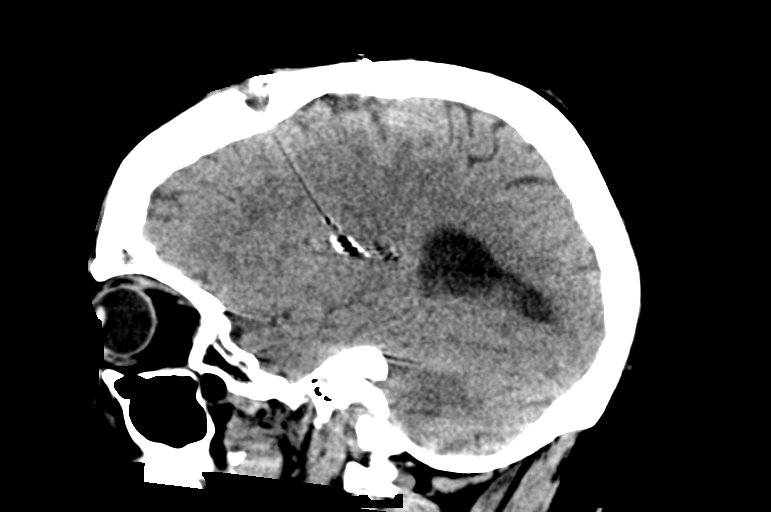

[Series 7: head without (person_name) · axial · non-contrast · 0.51mm/px · z∈[-125,-5]mm · 5 of 38 slices shown, 7 images]
[im 7/38  brain]
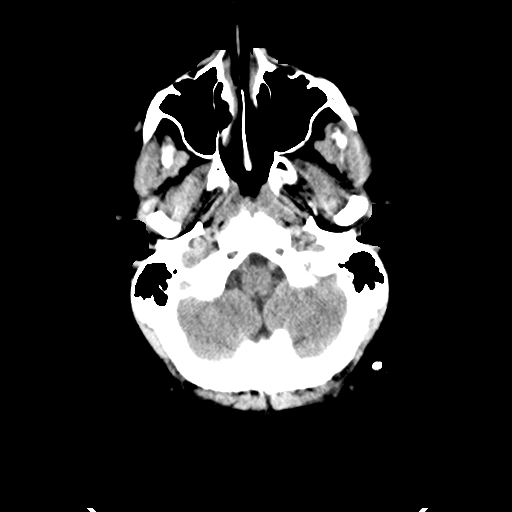
[im 7/38  bone]
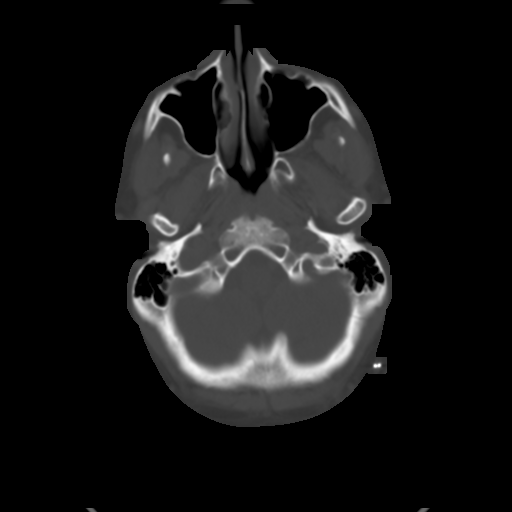
[im 13/38  brain]
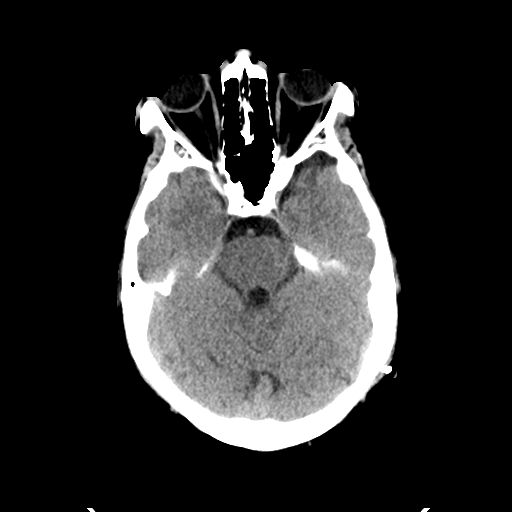
[im 19/38  brain]
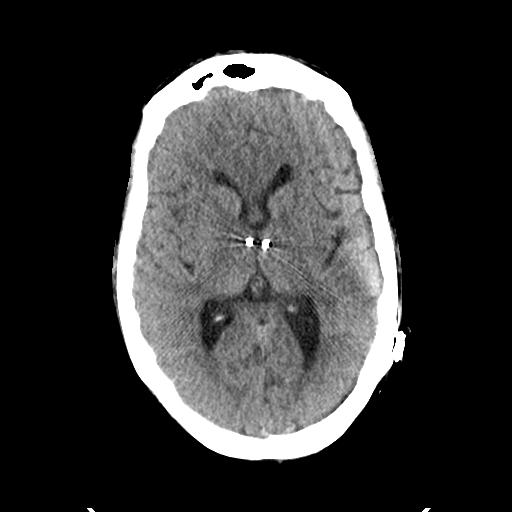
[im 25/38  brain]
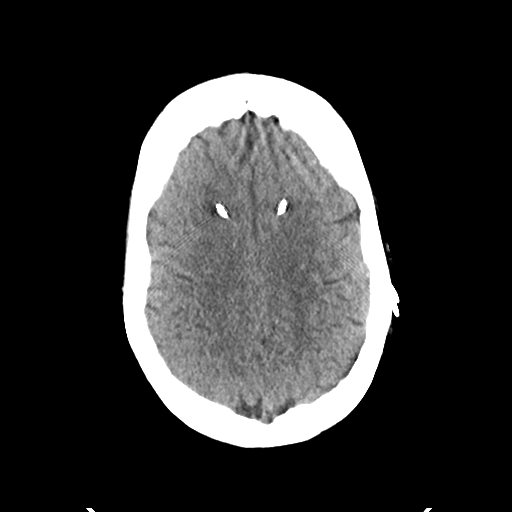
[im 31/38  brain]
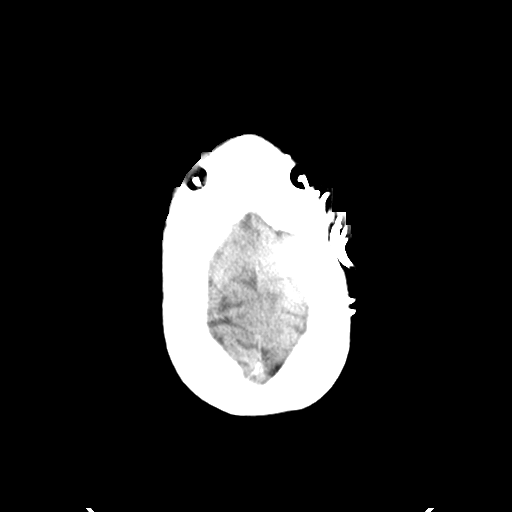
[im 31/38  bone]
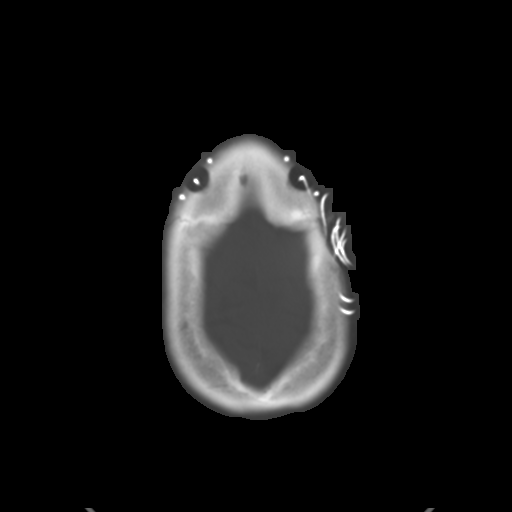

[15 of 47 positions shown; findings below may reference images not displayed]

FINDINGS: Brain: Vertex approach bilateral deep brain stimulator electrodes
appear stable in configuration from earlier this month. Hypodensity
in the superior frontal lobes along the course of the electrodes has
regressed, minimal residual on the right.

No superimposed No midline shift, ventriculomegaly, mass effect,
evidence of mass lesion, intracranial hemorrhage or evidence of
cortically based acute infarction. Stable gray-white matter
differentiation elsewhere.

Vascular: No suspicious intracranial vascular hyperdensity.

Skull: Stable superior bifrontal burr holes. No acute osseous
abnormality identified.

Sinuses/Orbits: Visualized paranasal sinuses and mastoids are clear.

Other: No acute orbit or scalp soft tissue finding. Left side scalp
electrodes continuing inferiorly appear stable.
IMPRESSION: 1. Largely resolved frontal lobe edema along the course of the deep
brain stimulator leads since 01/16/2020. No adverse hardware
features.
2. Otherwise negative, no new intracranial abnormality.

## 2021-08-06 IMAGING — DX DG CHEST 2V
2 series · 2 of 2 positions shown · non-contrast
Comparison: January 21, 2020.

CLINICAL DATA: Chest pain.

EXAM:
CHEST - 2 VIEW

[chest ap]
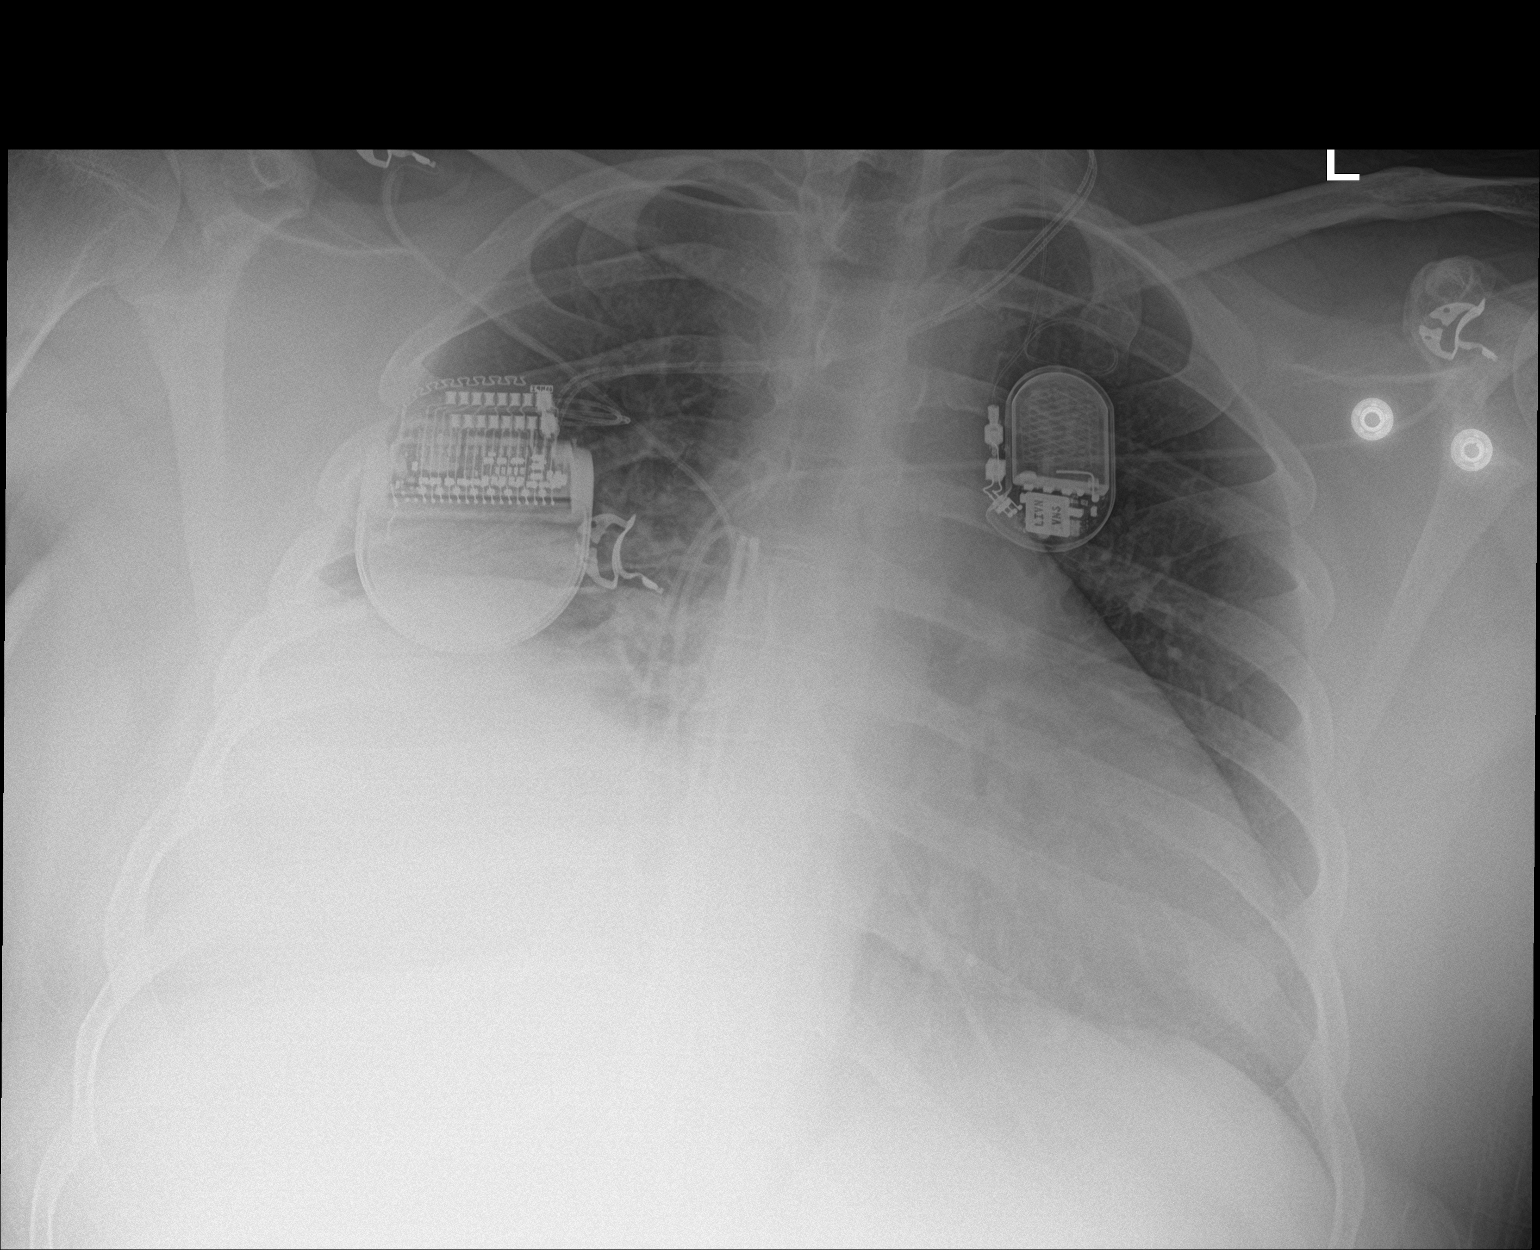

[chest lat]
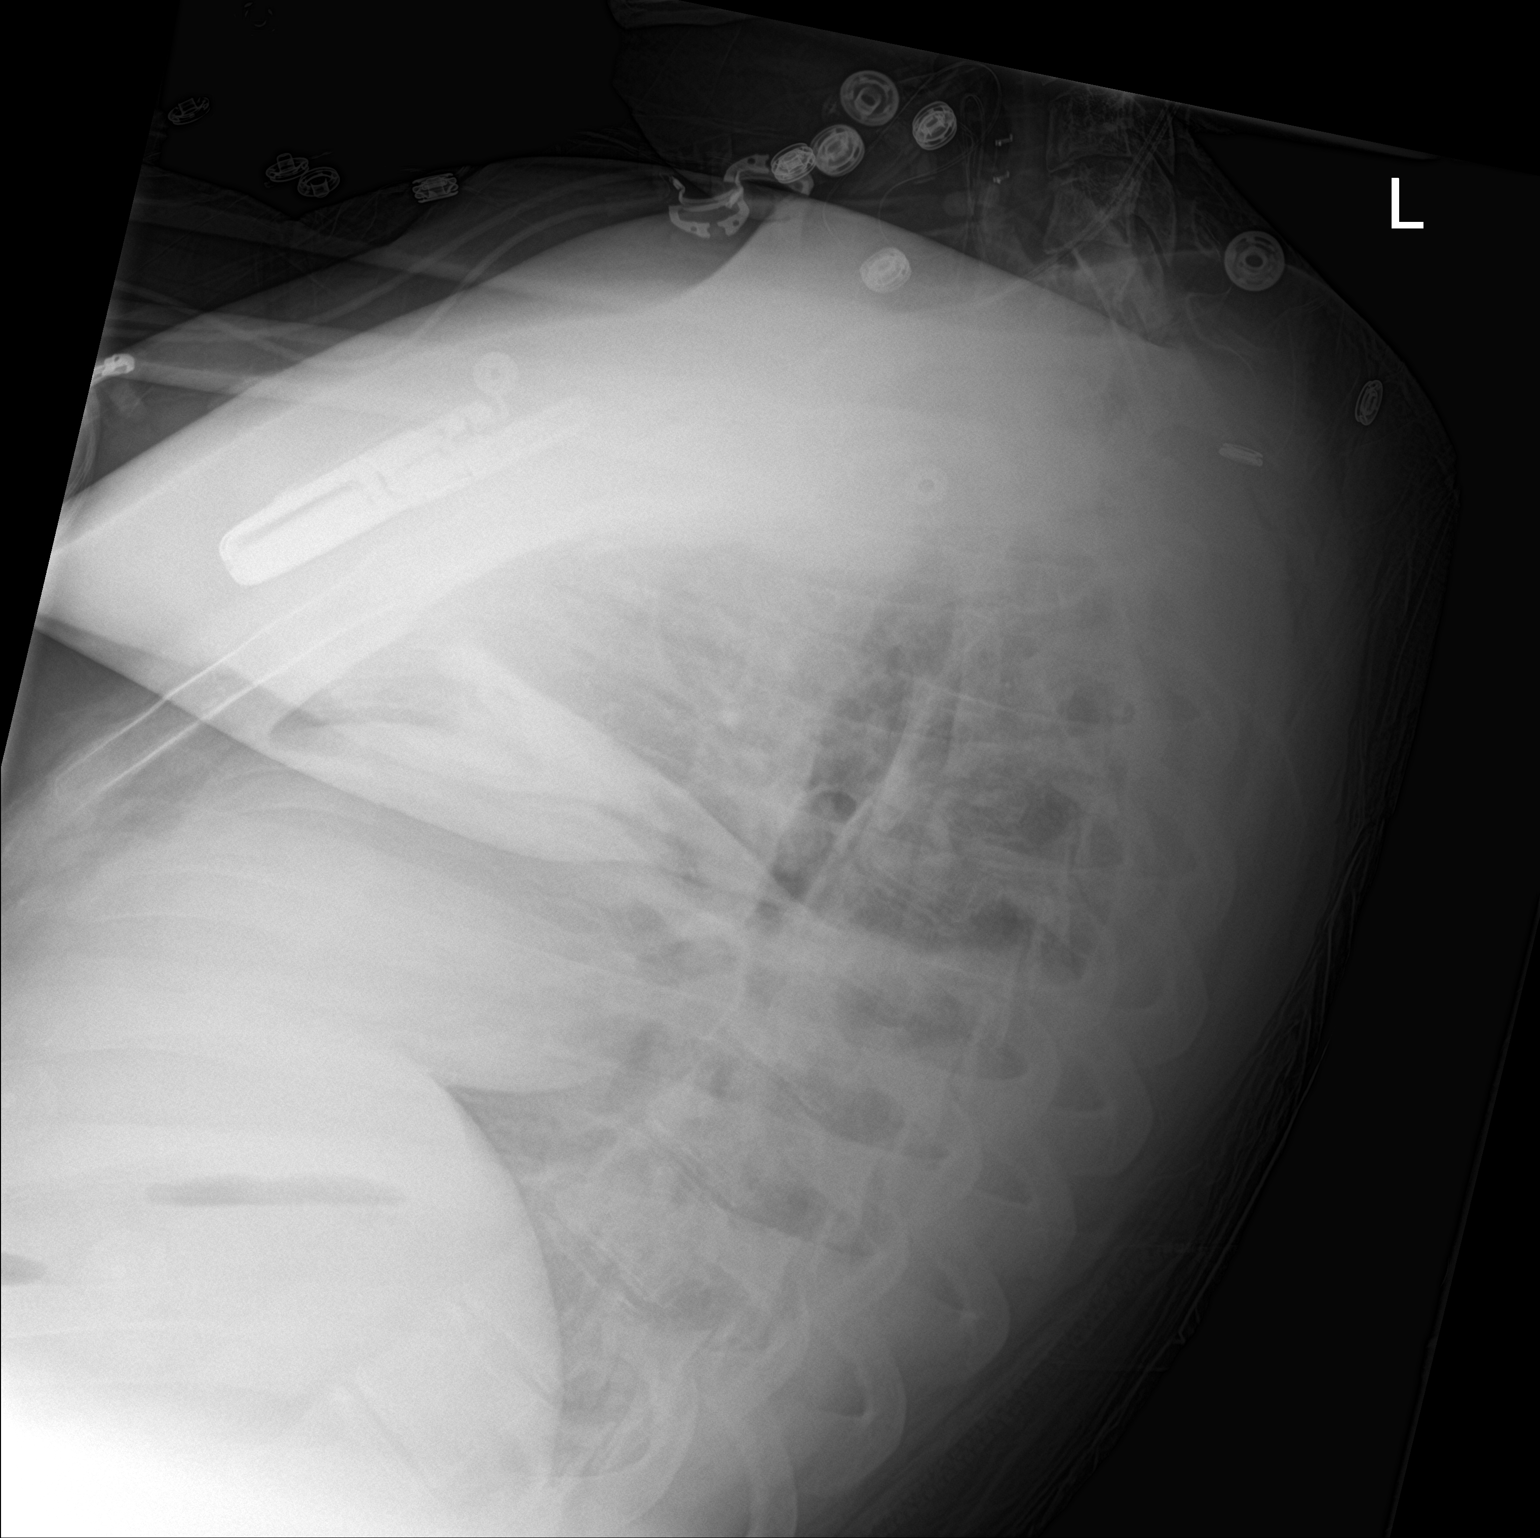

[2 of 2 positions shown; findings below may reference images not displayed]

FINDINGS: Stable cardiomediastinal silhouette. Stable stimulator generator
seen over the upper chest. No pneumothorax is noted. Left lung is
clear. Elevated right hemidiaphragm is noted. New right basilar
opacity is noted concerning for right middle lobe and or lower lobe
pneumonia with associated effusion. Bony thorax is unremarkable.
IMPRESSION: New right basilar opacity is noted concerning for right middle lobe
and/or lower lobe pneumonia with associated effusion.

## 2023-09-01 ENCOUNTER — Encounter (HOSPITAL_COMMUNITY): Payer: Self-pay | Admitting: Emergency Medicine

## 2023-09-01 ENCOUNTER — Emergency Department (HOSPITAL_COMMUNITY)
Admission: EM | Admit: 2023-09-01 | Discharge: 2023-09-02 | Disposition: A | Discharge status: Home / Self Care | Attending: Emergency Medicine | Admitting: Emergency Medicine

## 2023-09-01 ENCOUNTER — Other Ambulatory Visit: Payer: Self-pay

## 2023-09-01 DIAGNOSIS — R569 Unspecified convulsions: Secondary | ICD-10-CM | POA: Insufficient documentation

## 2023-09-01 DIAGNOSIS — Z7901 Long term (current) use of anticoagulants: Secondary | ICD-10-CM | POA: Insufficient documentation

## 2023-09-01 DIAGNOSIS — D72819 Decreased white blood cell count, unspecified: Secondary | ICD-10-CM | POA: Diagnosis not present

## 2023-09-01 NOTE — ED Provider Notes (Signed)
 [facility] AT [facility] Provider Note   CSN: [id number] Arrival date & time: 09/01/23  2347     Patient presents with: Seizures   Joe Thomas is a 23 y.o. male.   HPI Patient presents for seizures.  Medical history includes epilepsy, PE.  He has had seizures since childhood.  He is followed by [facility] pediatric neurology.  Per chart review, frequency of seizures is typically 1-4 times per week.  Current AEDs include the following: Xcopri  200 mg nightly, Depakote  ER 1500 mg nightly, Vimpat  400 mg nightly, Klonopin  as needed.  He has a deep brainstem stimulator.  A week ago, he ran out of his Xcopri .  Pharmacy has not been willing to fill it without any prescription.  Patient states that he has been having 4-6 seizures per day for the past several days.  These are consistent with his baseline seizures.  Typically, he tenses throughout his body and it last for 15 seconds.  During one of his seizures today, he did fall from standing backwards and struck the back of his head in the bathroom.  EMS witnessed 2 seizure episodes during transit and gave him 2.5 mg of Versed .  He has not had any further seizure episodes.  Patient denies any current headache, dizziness, nausea.    Prior to Admission medications   Medication Sig Start Date End Date Taking? Authorizing Provider  apixaban  (ELIQUIS ) 5 MG TABS tablet Take 1 tablet (5 mg total) by mouth 2 (two) times daily. PLEASE START ONLY AFTER THE STARTER PACK HAS BEEN COMPLETED. 02/29/20   Krishnan, Gokul, MD  APIXABAN  (ELIQUIS ) VTE STARTER PACK (10MG  AND 5MG ) Take as directed on package: start with two-5mg  tablets twice daily for 7 days. On day 8, switch to one-5mg  tablet twice daily. 01/23/20   Krishnan, Gokul, MD  Cenobamate  200 MG TABS Take 200 mg by mouth at bedtime. 01/14/20   [provider]  clonazePAM  (KLONOPIN ) 1 MG tablet Take 2 mg by mouth daily as needed for anxiety (multiple seizures).     [provider]  divalproex  (DEPAKOTE  ER) 500 MG 24 hr tablet Take 1,500 mg by mouth at bedtime.    [provider]  lacosamide  (VIMPAT ) 200 MG TABS tablet Take 400 mg by mouth at bedtime.    [provider]  Midazolam  (NAYZILAM ) 5 MG/0.1ML SOLN Place 1 each into the nose See admin instructions. Use 1 spray nasally as needed for seizures that presist 5 mins after trying clonazepam  09/04/19   [provider]  Multiple Vitamins-Minerals (ONE-A-DAY FOR HIM VITACRAVES PO) Take 1 tablet by mouth daily.    [provider]    Allergies: Keppra [levetiracetam] and Penicillins    Review of Systems  Neurological:  Positive for seizures.  All other systems reviewed and are negative.   Updated Vital Signs BP 131/85 (BP Location: Right Arm)   Pulse 82   Temp 98.5 F (36.9 C) (Oral)   Resp 16   Ht 6' 2 (1.88 m)   Wt 70.3 kg   SpO2 100%   BMI 19.90 kg/m   Physical Exam Vitals and nursing note reviewed.  Constitutional:      General: He is not in acute distress.    Appearance: Normal appearance. He is well-developed. He is not ill-appearing, toxic-appearing or diaphoretic.  HENT:     Head: Normocephalic and atraumatic.     Right Ear: External ear normal.     Left Ear: External ear normal.  Nose: Nose normal.     Mouth/Throat:     Mouth: Mucous membranes are moist.     Comments: No tongue bite Eyes:     Extraocular Movements: Extraocular movements intact.     Conjunctiva/sclera: Conjunctivae normal.  Cardiovascular:     Rate and Rhythm: Normal rate and regular rhythm.  Pulmonary:     Effort: Pulmonary effort is normal. No respiratory distress.  Abdominal:     General: There is no distension.     Palpations: Abdomen is soft.     Tenderness: There is no abdominal tenderness.  Musculoskeletal:        General: No swelling. Normal range of motion.     Cervical back: Normal range of motion and neck supple.  Skin:    General: Skin is warm  and dry.     Coloration: Skin is not jaundiced or pale.  Neurological:     General: No focal deficit present.     Mental Status: He is alert and oriented to person, place, and time.     Cranial Nerves: No cranial nerve deficit.     Sensory: No sensory deficit.     Motor: No weakness.     Coordination: Coordination normal.  Psychiatric:        Mood and Affect: Mood normal.        Behavior: Behavior normal.     (all labs ordered are listed, but only abnormal results are displayed) Labs Reviewed  COMPREHENSIVE METABOLIC PANEL WITH GFR - Abnormal; Notable for the following components:      Result Value   Calcium 8.0 (*)    Total Protein 5.1 (*)    Albumin 3.3 (*)    Alkaline Phosphatase 30 (*)    All other components within normal limits  CBC WITH DIFFERENTIAL/PLATELET - Abnormal; Notable for the following components:   WBC 3.5 (*)    RBC 3.88 (*)    Hemoglobin 12.2 (*)    HCT 35.4 (*)    Platelets 118 (*)    All other components within normal limits  MAGNESIUM  - Abnormal; Notable for the following components:   Magnesium  1.6 (*)    All other components within normal limits  URINALYSIS, ROUTINE W REFLEX MICROSCOPIC  CBG MONITORING, ED    EKG: EKG Interpretation Date/Time:  Sunday September 01 2023 23:53:46 EDT Ventricular Rate:  85 PR Interval:  149 QRS Duration:  88 QT Interval:  340 QTC Calculation: 405 R Axis:   53  Text Interpretation: Sinus rhythm Borderline T wave abnormalities Confirmed by Melvenia Motto [id number]) on 09/02/2023 1:44:06 AM  Radiology: CT HEAD WO CONTRAST Result Date: 09/02/2023 CLINICAL DATA:  Seizures. EXAM: CT HEAD WITHOUT CONTRAST TECHNIQUE: Contiguous axial images were obtained from the base of the skull through the vertex without intravenous contrast. RADIATION DOSE REDUCTION: This exam was performed according to the departmental dose-optimization program which includes automated exposure control, adjustment of the mA and/or kV according to patient size  and/or use of iterative reconstruction technique. COMPARISON:  January 24, 2020 FINDINGS: Brain: No evidence of acute infarction, hemorrhage, hydrocephalus, extra-axial collection or mass lesion/mass effect. Stable bilateral deep brain stimulator electrode positioning is noted. Vascular: No hyperdense vessel or unexpected calcification. Skull: Normal. Negative for fracture or focal lesion. Sinuses/Orbits: No acute finding. Other: Mild to moderate severity scalp soft tissue swelling is seen along the posterior aspect of the vertex on the right. IMPRESSION: 1. No acute intracranial abnormality. 2. Stable bilateral deep brain stimulator electrode positioning. 3. Mild to  moderate severity scalp soft tissue swelling along the posterior aspect of the vertex on the right. Electronically Signed   By: Suzen Dials M.D.   On: 09/02/2023 01:11   DG Cervical Spine Complete Result Date: 09/02/2023 EXAM: 6 or more VIEW(S) XRAY OF THE CERVICAL SPINE 09/02/2023 12:29:00 AM COMPARISON: None available. CLINICAL HISTORY: [id number] Fall [id number]. Encounter for fall from seizure FINDINGS: BONES: No acute fracture. No aggressive appearing osseous lesion. Alignment is normal. DISCS AND DEGENERATIVE CHANGES: No severe degenerative changes. SOFT TISSUES: No prevertebral soft tissue swelling. The visualized lungs appear clear. LINES AND TUBES: Left neck and deep brain stimulators in place. IMPRESSION: 1. No acute abnormality of the cervical spine. Electronically signed by: Pinkie Pebbles MD 09/02/2023 12:31 AM EDT RP Workstation: [id number]   DG Chest Portable 1 View Result Date: 09/02/2023 EXAM: 1 VIEW XRAY OF THE CHEST 09/02/2023 12:28:00 AM COMPARISON: 01/24/2020 CLINICAL HISTORY: Fall. Encounter for fall from seizures. FINDINGS: LUNGS AND PLEURA: No focal pulmonary opacity. No pulmonary edema. No pleural effusion. No pneumothorax. HEART AND MEDIASTINUM: No acute abnormality of the cardiac and mediastinal silhouettes. BONES AND  SOFT TISSUES: No acute osseous abnormality. Left neck stimulators noted. IMPRESSION: 1. No acute process. Electronically signed by: Pinkie Pebbles MD 09/02/2023 12:31 AM EDT RP Workstation: [id number]     Procedures   Medications Ordered in the ED  lactated ringers  bolus 1,000 mL (0 mLs Intravenous Stopped 09/02/23 0308)  magnesium  sulfate IVPB 2 g 50 mL (0 g Intravenous Stopped 09/02/23 0309)  lamoTRIgine  (LAMICTAL  XR) 24 hour tablet 50 mg (50 mg Oral Given 09/02/23 0308)                                    Medical Decision Making Amount and/or Complexity of Data Reviewed Labs: ordered. Radiology: ordered.  Risk Prescription drug management.   This patient presents to the ED for concern of seizures, this involves an extensive number of treatment options, and is a complaint that carries with it a high risk of complications and morbidity.  The differential diagnosis includes medication nonadherence, head trauma, infection, metabolic derangements   Co morbidities / Chronic conditions that complicate the patient evaluation  Epilepsy, PE   Additional history obtained:  Additional history obtained from EMR External records from outside source obtained and reviewed including EMS, patient's mother   Lab Tests:  I Ordered, and personally interpreted labs.  The pertinent results include: Normal hemoglobin, mild leukopenia, normal kidney function, no evidence of UTI.  Hypomagnesemia is present.   Imaging Studies ordered:  I ordered imaging studies including chest x-ray, CT of head and cervical spine I independently visualized and interpreted imaging which showed mild scalp soft tissue swelling without any other acute findings. I agree with the radiologist interpretation   Cardiac Monitoring: / EKG:  The patient was maintained on a cardiac monitor.  I personally viewed and interpreted the cardiac monitored which showed an underlying rhythm of: Sinus rhythm   Problem List /  ED Course / Critical interventions / Medication management  Patient presents for increase seizure activity over the past several days.  This is in the setting of running out of one of his 3 AEDs.  He has been unable to take his other AEDs, including tonight's evening doses.  On arrival in the ED, he is alert and oriented.  He has no focal neurologic deficits.  He denies any current complaints.  Cenobamate  not able  to be ordered in the ED.  Workup was initiated.  Lab work is notable for hypomagnesemia and a mild leukopenia.  Repletion magnesium  was ordered.  Per chart review, he was started on Xcopri  3-1/2 years ago.  At the time, he weaned off of Lamictal  at nighttime.  Given no Xcopri  available at this time, low-dose Lamictal  was given as bridge.  Patient's mother states that she will be able to get the Xcopri  refilled, likely tomorrow.  Imaging studies did not show any acute findings.  CT imaging of head showed some scalp soft tissue swelling consistent with his recent head trauma.  No other acute findings were identified.  Patient remained asymptomatic while in the ED.  He was discharged in stable condition. I ordered medication including IV fluids for hydration, magnesium  sulfate for hypomagnesemia, Lamictal  for seizure prophylaxis Reevaluation of the patient after these medicines showed that the patient improved I have reviewed the patients home medicines and have made adjustments as needed   Social Determinants of Health:  Has access to outpatient care     Final diagnoses:  Seizures [facility])    ED Discharge Orders     None          Melvenia Motto, MD 09/02/23 [phone]

## 2023-09-01 NOTE — ED Triage Notes (Signed)
 BIB [facility] - Pt has hx of seizures, been out of meds x 1 week. Xcopri  - difficult to obtain.   Pt seizures have been happening more frequently. EMS reports seizure x 2 with them.   20 g LAC, 2.5 mg midazolam  given- no further seizure activity since admin.   BP 130/88 HR 70  Resp 22 Spo2 100 BG 100

## 2023-09-02 ENCOUNTER — Emergency Department (HOSPITAL_COMMUNITY)

## 2023-09-02 LAB — CBC WITH DIFFERENTIAL/PLATELET
Abs Immature Granulocytes: 0.01 K/uL (ref 0.00–0.07)
Basophils Absolute: 0 K/uL (ref 0.0–0.1)
Basophils Relative: 1 %
Eosinophils Absolute: 0 K/uL (ref 0.0–0.5)
Eosinophils Relative: 1 %
HCT: 35.4 % — ABNORMAL LOW (ref 39.0–52.0)
Hemoglobin: 12.2 g/dL — ABNORMAL LOW (ref 13.0–17.0)
Immature Granulocytes: 0 %
Lymphocytes Relative: 27 %
Lymphs Abs: 1 K/uL (ref 0.7–4.0)
MCH: 31.4 pg (ref 26.0–34.0)
MCHC: 34.5 g/dL (ref 30.0–36.0)
MCV: 91.2 fL (ref 80.0–100.0)
Monocytes Absolute: 0.4 K/uL (ref 0.1–1.0)
Monocytes Relative: 12 %
Neutro Abs: 2.1 K/uL (ref 1.7–7.7)
Neutrophils Relative %: 59 %
Platelets: 118 K/uL — ABNORMAL LOW (ref 150–400)
RBC: 3.88 MIL/uL — ABNORMAL LOW (ref 4.22–5.81)
RDW: 13 % (ref 11.5–15.5)
WBC: 3.5 K/uL — ABNORMAL LOW (ref 4.0–10.5)
nRBC: 0 % (ref 0.0–0.2)

## 2023-09-02 LAB — URINALYSIS, ROUTINE W REFLEX MICROSCOPIC
Bilirubin Urine: NEGATIVE
Glucose, UA: NEGATIVE mg/dL
Hgb urine dipstick: NEGATIVE
Ketones, ur: NEGATIVE mg/dL
Leukocytes,Ua: NEGATIVE
Nitrite: NEGATIVE
Protein, ur: NEGATIVE mg/dL
Specific Gravity, Urine: 1.027 (ref 1.005–1.030)
pH: 6 (ref 5.0–8.0)

## 2023-09-02 LAB — COMPREHENSIVE METABOLIC PANEL WITH GFR
ALT: 10 U/L (ref 0–44)
AST: 15 U/L (ref 15–41)
Albumin: 3.3 g/dL — ABNORMAL LOW (ref 3.5–5.0)
Alkaline Phosphatase: 30 U/L — ABNORMAL LOW (ref 38–126)
Anion gap: 8 (ref 5–15)
BUN: 20 mg/dL (ref 6–20)
CO2: 25 mmol/L (ref 22–32)
Calcium: 8 mg/dL — ABNORMAL LOW (ref 8.9–10.3)
Chloride: 110 mmol/L (ref 98–111)
Creatinine, Ser: 0.68 mg/dL (ref 0.61–1.24)
GFR, Estimated: >60 mL/min (ref >60)
Glucose, Bld: 80 mg/dL (ref 70–99)
Potassium: 3.9 mmol/L (ref 3.5–5.1)
Sodium: 143 mmol/L (ref 135–145)
Total Bilirubin: 0.5 mg/dL (ref 0.0–1.2)
Total Protein: 5.1 g/dL — ABNORMAL LOW (ref 6.5–8.1)

## 2023-09-02 LAB — MAGNESIUM: Magnesium: 1.6 mg/dL — ABNORMAL LOW (ref 1.7–2.4)

## 2023-09-02 LAB — CBG MONITORING, ED: Glucose-Capillary: 84 mg/dL (ref 70–99)

## 2023-09-02 MED ORDER — LAMOTRIGINE ER 50 MG PO TB24
50.0000 mg | ORAL_TABLET | Freq: Once | ORAL | Status: AC
  Administered 2023-09-02: 50 mg via ORAL
  Filled 2023-09-02: qty 1

## 2023-09-02 MED ORDER — LACTATED RINGERS IV BOLUS
1000.0000 mL | Freq: Once | INTRAVENOUS | Status: AC
  Administered 2023-09-02: 1000 mL via INTRAVENOUS

## 2023-09-02 MED ORDER — MAGNESIUM SULFATE 2 GM/50ML IV SOLN
2.0000 g | Freq: Once | INTRAVENOUS | Status: AC
  Administered 2023-09-02: 2 g via INTRAVENOUS
  Filled 2023-09-02: qty 50

## 2023-09-02 NOTE — Discharge Instructions (Addendum)
 Your test results today were reassuring.  Your magnesium  was low but this was replaced.  Continue home medications as prescribed.  Follow-up with your neurologist.  Return to the emergency department for any new or worsening symptoms of concern.

## 2023-10-23 NOTE — Telephone Encounter (Signed)
 Attempted to call back Toribio from the lab. Notes that lab was already acknowledged and no longer needed to be reported.  User Time Read / Oscar Berwyn [place], RN 10/23/2023  1:10 PM

## 2023-10-23 NOTE — Telephone Encounter (Signed)
 Toribio from [employer] back [phone] CRITICAL: Valproic  Acid  Call transferred to Nurse Fenton. Call dropped provided call back

## 2023-10-23 NOTE — Progress Notes (Signed)
 HISTORY OF PRESENT ILLNESS Mr. Joe Joe Thomas is a 23 y.o. male with a history significant for intractable epilepsy who presents for follow up of seizures and neuromodulation.  Greater than 45 minutes were spent in reviewing his past medical information, clinic visit, and documentation.   Last patient visit was 07/17/23   Interval History  Patient reports that he is doing well but still having breakthrough seizures.  The seizures occur in clusters.  He had a prolonged cluster on September 01, 2023 in the setting of being unable to fill his prescription of Xcopri .  This led to breakthrough seizures for which he was evaluated in [facility] emergency department.Frequency is once a week to 3-4 a week at maximum. No rescue medications required, no Klonopin  use. No absence seizures for many years, these are all generalized seizures. Seizures are mainly at night during sleep.    Still working at [employer] in [place]. 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   Tolerated DBS/VNS. SInce last VNS batter replacement.     Current Medications: 1. Xcopri  200 mg at night 2. Depakote  ER 500 mg to take 3 tablets (1500 mg) at night 3. Vimpat  400 mg at night 4. Clonazepam  as needed He reports no changes in medical history since last visit.  He is still working at [employer].     Epilepsy Surgical Evaluation: PET scan (10/03/15) hypometabolism in the left temporal lobe. Brain MRI (10/04/15) showed increased signal in the left hippocampus with prominence of CSF spaces in the left temporal lobe. EMU (11/17). LTM showed frequent frontal-central spikes which were abundant during sleep.  13 seizures were recorded over 4 days with a semiology consistent with nocturnal frontal lobe seizures. The EEG was nonlocalizing. Neuropsychological testing (02/14/16) which indicated anterior dysfunction. MEG scan (05/17/16) epileptiform activity in the left posterior dorsolateral frontal cortex and left  interhemispheric fissure   HOME MEDICATIONS Current Rx ordered in [facility][1]  Objective Vitals:   10/23/23 1052  BP: 127/89  Pulse: 68  SpO2: 100%      Physical Exam   GENERAL:  No acute distress.  ENT:   Atraumatic, normocephalic PULM: unlabored breathing on room air CARDIO: warm and well perfused SKIN:  no apparent rashes or lesions on exposed skin   MENTAL STATUS EXAM:   Alert and oriented to person, place and time. Follows all commands. Speech is clear and language is fluent.    CRANIAL NERVES:    CN 2 (Optic): Visual fields intact to confrontation CN 3,4,6 (EOM): Pupils equal and reactive to light. Full extraocular eye movement without nystagmus, mild right esotropia.  CN 5 (Trigeminal): Facial sensation is normal. CN 7 (Facial): No facial weakness or asymmetry.  CN 8 (Auditory): Auditory acuity grossly normal.  CN 9,10 (Glossophar): The uvula is midline, the palate elevates symmetrically.  CN 11 (spinal access): Head midline, shoulders elevate with full strength bilaterally CN 12 (Hypoglossal): The tongue is midline. No atrophy or fasciculations.    MOTOR:       Right Left  Deltoid 5 5  Biceps 5 5  Triceps 5 5  Hip flexion 5 5  Knee extension 5 5  Knee flexion 5 5  Dorsiflexion 5 5  Plantar flexion 5 5    Muscle Tone: Tone and muscle bulk are normal in the upper and lower extremities.    REFLEXES:  Right                Left Bi                     2+                    2+ Tri                    2+                    2+ BR                   2+                    2+ Patella             2+                    2+ Achilles            2+                    2+   COORDINATION:   Intact finger-to-nose, though with some low amplitude action and postural tremor not present at rest.   SENSATION:   Intact to light touch throughout   GAIT: Routine and tandem gait are normal.      MEDICAL DECISION MAKING Assessment/Plan Mr. Joe Joe Thomas  Joe Thomas is a 23 y.o. 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  Per history he continues to have breakthrough seizures, though some of this may be related to dose decrease of Xcopri  due to medication availability.  He is tolerating his medication regimen well without any noticeable/marked side effects.  We discussed the potential for increasing output of DBS from 5.5 mA to 6 mA.  We also discussed increasing his daily dose of Xcopri .  We will wait on these options as he may be a candidate for the [employer] research trial for intractable seizures.  If he is not a candidate then we will make these changes.     Intractable localization-related epilepsy:  1.  Continue Cenobamate  250 mg once daily.  With plan to increase to 300 mg once daily seizures. 2.  Continue Depakote  extended release 1250mg  at night   3.  Continue lacosamide  400 mg at night 4.  For cluster of seizures, clonazepam  2 mg.  Not to use more than 2 doses of nasal Versed  in a 6 hour time period.  5.  For seizure activity lasting greater than 5 minutes, intranasal midazolam  5 mg 6.  We discussed driving restrictions, no driving at least until 6 months seizure-free. 7.    We refilled his prescription at today's visit.  We interrogated (and programmed his DBS) no changes were made to his stimulation paradigm, however a new group was created for increasing the current to 6 mA if required.  Greater than 18 minutes were spent in programming his DBS. Current output: 5.5 mA, 110 s, 145 Hz.  For bilateral ANT.  8 minutes were spent in interrogating his VNS de      [1] Meds Ordered in [facility]  Medication Sig Dispense Refill  . acetaminophen  (TYLENOL ) 325 mg tablet Take 325 mg by mouth every 6 (six) hours as needed for mild pain (1-3) or headaches.    SABRA  cenobamate  (Xcopri ) 200 mg tab Take 200 mg by mouth  daily. 90 tablet 3  . cenobamate  (Xcopri ) 50 mg tab Take 50 mg by mouth daily. 90 tablet 3  . clonazePAM  (KlonoPIN ) 1 mg tablet Take 2 tablets (2 mg total) by mouth 2 (two) times a day. 120 tablet 5  . divalproex  (Depakote  ER) 250 mg extended release tablet Take 5 tablets (1,250 mg total) by mouth daily. 450 tablet 3  . lacosamide  (VIMPAT ) 200 mg tab TAKE TWO TABLETS BY MOUTH IN THE EVENING 60 tablet 4  . midazolam  (Nayzilam ) 5 mg/spray (0.1 mL) spry Administer 0.2 mL (10 mg total) into affected nostril(s) as needed (For seizures lasting greater than 5 minutes.). For seizures lasting greater than 5 minutes. 4 each 3  . multivit with min-folic acid (One-A-Day VitaCraves) 200 mcg chew Chew 1 tablet daily.     No current Epic-ordered facility-administered medications on file.

## 2023-10-23 NOTE — Telephone Encounter (Signed)
 Reviewed labs with Dr. Yates; he recommends that the depakote  dose be reduced by 250mg  and for him to follow up with PCP for further labs  Attempted to reach pt on his cell but no answer;  left message on mom's cell to reduce VPA by 250mg  to make his dose 1000mg  at bedtime.   Also let her know that we notified his FNP of labs and that her office may be in touch to schedule additional lab work

## 2023-10-29 NOTE — Telephone Encounter (Signed)
 Spoke w/pt to ensure that he rec'd message to reduce VPA;  he confirmed that he had reduced dose per directions.  He has not heard from PCP.  Advised that he may want to reach out to their office to see if a sooner appt or further labs are recommended.  Referral was made to Dr. Vona for DBS battery replacement.

## 2023-11-07 NOTE — Telephone Encounter (Signed)
 Regarding: needs vimpat  ----- Message from Lowrey C sent at 11/07/2023  6:23 PM EDT ----- Clinic Name:Neurology Caller/Relationship:Mother Reason for call:Vimpat  needs called into [facility] Additional comment:

## 2023-11-07 NOTE — Telephone Encounter (Signed)
 PACT Call  Health history, medications, allergies reviewed with caller.  Reason for call: :Vimpat  needs called into [facility]    Current Symptoms: Mom notes regular pharmacy doesn't have Vimpat  in stock.  [facility] & If Effective: na   Denies: no symptoms of concern  [id number] sent to on call to address Per Laymon Saupe, MD RX was sent.  Mom notified  Reason for Conversation Medication Refill (Nurse Triage /)   General Assessment     Do you have pain:  Do you have any pain?: No         Disposition  Call PCP Now  Reason for Disposition   .  [1] Prescription refill request for ESSENTIAL medicine (i.e., likelihood of harm to patient if not taken) AND [2] triager unable to refill per department policy  1. DRUG NAME: What medicine do you need to have refilled?     Vimpat  2. REFILLS REMAINING: How many refills are remaining? Notes: The label on the medicine or pill bottle will show how many refills are remaining. If there are no refills remaining, then a renewal may be needed.     4 3. EXPIRATION DATE: What is the expiration date? Note: The label states when the prescription will expire, and thus can no longer be refilled.)     See chart 4. PRESCRIBER: Who prescribed it? Note: The prescribing doctor or group is responsible for refill approvals..     neuro 5. PHARMACY: Have you contacted your pharmacy (drugstore)? Note: Some pharmacies will contact the doctor (or NP/PA).      yes 6. SYMPTOMS: Do you have any symptoms?     no 7. PREGNANCY: Is there any chance that you are pregnant? When was your last menstrual period?     na  No Additional Information on file.  Protocols Used    Medication Refill and Renewal Call-Adult-AH

## 2023-11-12 NOTE — Telephone Encounter (Signed)
 Division of Pharmacy Services  Medication History Completion Note  Name/DOB/Age of Patient: Joe Thomas / Dec 25, 2000 / 23 y.o.  Location: [facility]  Type: Presurgical & Modality: Phone  Confirmed two patient identifiers: Yes  Confirmed patient is alert & oriented: Yes  Medication History Source (Med History Informants):  Patient Other Dr. Annemarie   Asked about any missing medications (such as pumps, injectable meds, TPN, OTC, etc): Yes  PTA Med List:  Prior to Admission Medications     Reviewed by Kelly Dorothyann Dolly, CPhT on 11/12/23 at 1205    Medication Sig Last Dose Informant Taking? Status  Patient not taking:   Discontinued 11/12/23 1157 (Med List Clean up - No Rx eCancel Message Sent)  cenobamate  (Xcopri ) 200 mg tab Take 200 mg by mouth daily.  Patient taking differently: Take 200 mg with 50 mg tablet by mouth once daily for total dose of 250 mg.   11/12/2023 Self, Other Yes Active         Med Note (BENNETT, ELISE C   Tue Nov 12, 2023 12:01 PM) Medication last fill date 10/30/2023 [facility]   cenobamate  (Xcopri ) 50 mg tab Take 50 mg by mouth daily.  Patient taking differently: Take 50 mg with 200 mg tablet by mouth once daily for total dose of 250 mg.   11/12/2023 Self, Other Yes Active         Med Note (BENNETT, ELISE C   Tue Nov 12, 2023 12:01 PM) Medication last fill date 10/29/2023 [facility]  clonazePAM  (KlonoPIN ) 1 mg tablet Take 2 tablets (2 mg total) by mouth 2 (two) times a day. 11/12/2023 Self, Other Yes Active         Med Note (BENNETT, ELISE C   Tue Nov 12, 2023 12:03 PM) Medication last fill date 01/23/2023 [facility]120/[facility]   divalproex  (Depakote  ER) 250 mg extended release tablet Take 4 tablets (1,000 mg total) by mouth daily. 11/12/2023 Self, Other Yes Active         Med Note (BENNETT, ELISE C   Tue Nov 12, 2023 12:05 PM) Recently reduced from 5 tablets daily  lacosamide  (VIMPAT ) 200 mg tab TAKE TWO TABLETS BY MOUTH IN THE  EVENING  Patient taking differently: Take 400 mg by mouth every evening.   11/11/2023 Evening Self, Other Yes Active         Med Note (BENNETT, ELISE C   Tue Nov 12, 2023 12:04 PM) Medication last fill date 11/07/2023 [facility]  midazolam  (Nayzilam ) 5 mg/spray (0.1 mL) spry Administer 0.2 mL (10 mg total) into affected nostril(s) as needed (For seizures lasting greater than 5 minutes.). For seizures lasting greater than 5 minutes.  Self, Other  Active  multivit with min-folic acid (One-A-Day VitaCraves) 200 mcg chew Chew 1 tablet daily. 11/12/2023 Self, Other Yes Active            Selected Pharmacy:  [id number] [place] - [address] - PHONE: [phone] - FAX: [phone]  Comments:  All medications and allergies were verified over the phone by the patient who is a good historian, and last fills/doses were verified by Dr. Annemarie.  Medications removed: APAP (therapy complete)  Reviewed by Certified Medication List Pharmacy Technician Electronically signed by: Kelly Dorothyann Dolly, CPhT 11/12/2023 12:05 PM  Anticipated Procedure Date: 11/27/2023  DPS DECLINED    Medications reconciled by provider: No   Signature/Co-signature, if required: Kelly Dorothyann Dolly, CPhT  Date/Time: 11/12/2023 12:06 PM

## 2023-11-24 ENCOUNTER — Emergency Department (HOSPITAL_COMMUNITY)
Admission: EM | Admit: 2023-11-24 | Discharge: 2023-11-25 | Disposition: A | Discharge status: Home / Self Care | Attending: Emergency Medicine | Admitting: Emergency Medicine

## 2023-11-24 DIAGNOSIS — Z7901 Long term (current) use of anticoagulants: Secondary | ICD-10-CM | POA: Insufficient documentation

## 2023-11-24 DIAGNOSIS — G40909 Epilepsy, unspecified, not intractable, without status epilepticus: Secondary | ICD-10-CM | POA: Insufficient documentation

## 2023-11-24 DIAGNOSIS — R569 Unspecified convulsions: Secondary | ICD-10-CM

## 2023-11-24 LAB — CBC WITH DIFFERENTIAL/PLATELET
Abs Immature Granulocytes: 0.02 K/uL (ref 0.00–0.07)
Basophils Absolute: 0 K/uL (ref 0.0–0.1)
Basophils Relative: 0 %
Eosinophils Absolute: 0 K/uL (ref 0.0–0.5)
Eosinophils Relative: 1 %
HCT: 41.9 % (ref 39.0–52.0)
Hemoglobin: 14.6 g/dL (ref 13.0–17.0)
Immature Granulocytes: 0 %
Lymphocytes Relative: 40 %
Lymphs Abs: 1.9 K/uL (ref 0.7–4.0)
MCH: 31.4 pg (ref 26.0–34.0)
MCHC: 34.8 g/dL (ref 30.0–36.0)
MCV: 90.1 fL (ref 80.0–100.0)
Monocytes Absolute: 0.4 K/uL (ref 0.1–1.0)
Monocytes Relative: 8 %
Neutro Abs: 2.4 K/uL (ref 1.7–7.7)
Neutrophils Relative %: 51 %
Platelets: 201 K/uL (ref 150–400)
RBC: 4.65 MIL/uL (ref 4.22–5.81)
RDW: 12.2 % (ref 11.5–15.5)
WBC: 4.8 K/uL (ref 4.0–10.5)
nRBC: 0 % (ref 0.0–0.2)

## 2023-11-24 LAB — COMPREHENSIVE METABOLIC PANEL WITH GFR
ALT: 12 U/L (ref 0–44)
AST: 20 U/L (ref 15–41)
Albumin: 4.9 g/dL (ref 3.5–5.0)
Alkaline Phosphatase: 44 U/L (ref 38–126)
Anion gap: 11 (ref 5–15)
BUN: 15 mg/dL (ref 6–20)
CO2: 29 mmol/L (ref 22–32)
Calcium: 9.9 mg/dL (ref 8.9–10.3)
Chloride: 100 mmol/L (ref 98–111)
Creatinine, Ser: 0.62 mg/dL (ref 0.61–1.24)
GFR, Estimated: >60 mL/min (ref >60)
Glucose, Bld: 82 mg/dL (ref 70–99)
Potassium: 4 mmol/L (ref 3.5–5.1)
Sodium: 140 mmol/L (ref 135–145)
Total Bilirubin: 0.4 mg/dL (ref 0.0–1.2)
Total Protein: 7.9 g/dL (ref 6.5–8.1)

## 2023-11-24 LAB — URINALYSIS, ROUTINE W REFLEX MICROSCOPIC
Bilirubin Urine: NEGATIVE
Glucose, UA: NEGATIVE mg/dL
Hgb urine dipstick: NEGATIVE
Ketones, ur: NEGATIVE mg/dL
Leukocytes,Ua: NEGATIVE
Nitrite: NEGATIVE
Protein, ur: NEGATIVE mg/dL
Specific Gravity, Urine: 1.001 — ABNORMAL LOW (ref 1.005–1.030)
pH: 6 (ref 5.0–8.0)

## 2023-11-24 LAB — VALPROIC ACID LEVEL: Valproic Acid Lvl: 73 ug/mL (ref 50–100)

## 2023-11-24 LAB — MAGNESIUM: Magnesium: 2 mg/dL (ref 1.7–2.4)

## 2023-11-24 MED ORDER — LORAZEPAM 2 MG/ML IJ SOLN
2.0000 mg | Freq: Once | INTRAMUSCULAR | Status: AC
Start: 2023-11-24 — End: 2023-11-24
  Administered 2023-11-24: 2 mg via INTRAVENOUS
  Filled 2023-11-24: qty 1

## 2023-11-24 MED ORDER — LACOSAMIDE 50 MG PO TABS
400.0000 mg | ORAL_TABLET | Freq: Every day | ORAL | Status: DC
  Administered 2023-11-24: 400 mg via ORAL
  Filled 2023-11-24: qty 8

## 2023-11-24 MED ORDER — DIVALPROEX SODIUM ER 500 MG PO TB24
1000.0000 mg | ORAL_TABLET | Freq: Every day | ORAL | Status: DC
  Administered 2023-11-24: 1000 mg via ORAL
  Filled 2023-11-24: qty 2

## 2023-11-24 MED ORDER — CENOBAMATE 200 MG PO TABS
200.0000 mg | ORAL_TABLET | Freq: Every day | ORAL | Status: DC
Start: 2023-11-24 — End: 2023-11-25

## 2023-11-24 MED ORDER — LACTATED RINGERS IV BOLUS
1000.0000 mL | Freq: Once | INTRAVENOUS | Status: AC
  Administered 2023-11-24: 1000 mL via INTRAVENOUS

## 2023-11-24 MED ORDER — LORAZEPAM 2 MG/ML IJ SOLN
2.0000 mg | Freq: Once | INTRAMUSCULAR | Status: AC
  Administered 2023-11-24: 2 mg via INTRAVENOUS
  Filled 2023-11-24: qty 1

## 2023-11-24 NOTE — ED Notes (Signed)
 PT mom came back with a milk shake and asked if he could have it. I let her know that if he was still having them then I didn't advise but also let her know that she was more aware of how he tolerated things while he had them and if she felt comfortable I couldn't stop her. She gave him the milk shake.

## 2023-11-24 NOTE — ED Provider Notes (Signed)
 [place] EMERGENCY DEPARTMENT AT [facility] Provider Note   CSN: [id number] Arrival date & time: 11/24/23  2015     Patient presents with: Seizures   Joe Thomas is a 23 y.o. male.    Seizures Patient presents for seizures.  Medical history includes epilepsy, PE.  He is prescribed Eliquis , Xcopri , Depakote , Vimpat , and Klonopin  as needed.  He has had a DBS placed.  Yesterday, patient was in his normal state of health.  He has been adherent to all of his home medications.  This morning, he had multiple seizures.  These are described as typically's but he has a 2 grand mal seizures today.  Mother reports that he occasionally have focal seizures in his left arm.  He received 2 doses of Klonopin  earlier in the day and did go to work.  He had continued seizure activity at work.  He was brought to the ED by his mother.  He has not yet received his evening doses of medications.     Prior to Admission medications   Medication Sig Start Date End Date Taking? Authorizing Provider  Cenobamate  200 MG TABS Take 200 mg by mouth at bedtime. Take along with 50 mg tablet=250 mg 01/14/20  Yes [provider]  Cenobamate  50 MG TABS Take 50 mg by mouth at bedtime. Take along with 200 mg tablet=250 mg   Yes [provider]  clonazePAM  (KLONOPIN ) 1 MG tablet Take 2 mg by mouth daily as needed for anxiety (multiple seizures).   Yes [provider]  divalproex  (DEPAKOTE  ER) 250 MG 24 hr tablet Take 1,000 mg by mouth at bedtime.   Yes [provider]  Midazolam  (NAYZILAM ) 5 MG/0.1ML SOLN Place 1 each into the nose See admin instructions. Use 1 spray nasally as needed for seizures that presist 5 mins after trying clonazepam  09/04/19  Yes [provider]  apixaban  (ELIQUIS ) 5 MG TABS tablet Take 1 tablet (5 mg total) by mouth 2 (two) times daily. PLEASE START ONLY AFTER THE STARTER PACK HAS BEEN COMPLETED. Patient not taking: Reported on 11/24/2023  02/29/20   Krishnan, Gokul, MD  APIXABAN  (ELIQUIS ) VTE STARTER PACK (10MG  AND 5MG ) Take as directed on package: start with two-5mg  tablets twice daily for 7 days. On day 8, switch to one-5mg  tablet twice daily. Patient not taking: Reported on 11/24/2023 01/23/20   Verdene Purchase, MD    Allergies: Keppra [levetiracetam] and Penicillins    Review of Systems  Neurological:  Positive for seizures.  All other systems reviewed and are negative.   Updated Vital Signs BP (!) 138/110 (BP Location: Left Arm)   Pulse 71   Temp 98.7 F (37.1 C) (Oral)   Resp 20   Ht 6' 2 (1.88 m)   Wt 74.8 kg   SpO2 100%   BMI 21.18 kg/m   Physical Exam Vitals and nursing note reviewed.  Constitutional:      General: He is not in acute distress.    Appearance: Normal appearance. He is well-developed. He is not ill-appearing, toxic-appearing or diaphoretic.  HENT:     Head: Normocephalic and atraumatic.     Right Ear: External ear normal.     Left Ear: External ear normal.     Nose: Nose normal.     Mouth/Throat:     Mouth: Mucous membranes are moist.  Eyes:     Extraocular Movements: Extraocular movements intact.     Conjunctiva/sclera: Conjunctivae normal.  Cardiovascular:     Rate  and Rhythm: Normal rate and regular rhythm.  Pulmonary:     Effort: Pulmonary effort is normal. No respiratory distress.  Abdominal:     General: There is no distension.     Palpations: Abdomen is soft.  Musculoskeletal:        General: No swelling.     Cervical back: Normal range of motion and neck supple.  Skin:    General: Skin is warm and dry.     Coloration: Skin is not jaundiced or pale.  Neurological:     General: No focal deficit present.     Mental Status: He is alert and oriented to person, place, and time.  Psychiatric:        Mood and Affect: Mood normal.        Behavior: Behavior normal.     (all labs ordered are listed, but only abnormal results are displayed) Labs Reviewed  COMPREHENSIVE  METABOLIC PANEL WITH GFR  CBC WITH DIFFERENTIAL/PLATELET  MAGNESIUM   VALPROIC  ACID LEVEL  URINALYSIS, ROUTINE W REFLEX MICROSCOPIC  URINE DRUG SCREEN  LACOSAMIDE   CBG MONITORING, ED    EKG: None  Radiology: No results found.   Procedures   Medications Ordered in the ED  cenobamate  (Xcopri ) TABS 200 mg (200 mg Oral Not Given 11/24/23 2255)  divalproex  (DEPAKOTE  ER) 24 hr tablet 1,000 mg (1,000 mg Oral Given 11/24/23 2252)  lacosamide  (VIMPAT ) tablet 400 mg (400 mg Oral Given 11/24/23 2252)  LORazepam  (ATIVAN ) injection 2 mg (2 mg Intravenous Given 11/24/23 2047)  lactated ringers  bolus 1,000 mL (0 mLs Intravenous Stopped 11/24/23 2240)  LORazepam  (ATIVAN ) injection 2 mg (2 mg Intravenous Given 11/24/23 2251)                                    Medical Decision Making Amount and/or Complexity of Data Reviewed Labs: ordered.  Risk Prescription drug management.   This patient presents to the ED for concern of seizures, this involves an extensive number of treatment options, and is a complaint that carries with it a high risk of complications and morbidity.  The differential diagnosis includes medication nonadherence, infection, poor sleep, other physiologic stress   Co morbidities / Chronic conditions that complicate the patient evaluation  epilepsy, PE   Additional history obtained:  Additional history obtained from EMR External records from outside source obtained and reviewed including patient's mother   Lab Tests:  I Ordered, and personally interpreted labs.  The pertinent results include: Hemoglobin, no leukocytosis, normal kidney function, normal electrolytes, therapeutic level of Depakote .  Cardiac Monitoring: / EKG:  The patient was maintained on a cardiac monitor.  I personally viewed and interpreted the cardiac monitored which showed an underlying rhythm of: Sinus rhythm   Problem List / ED Course / Critical interventions / Medication  management  Patient presenting for multiple seizures today.  He has a history of epilepsy since childhood.  He is on multiple AEDs.  At times, he will develop clusters of seizures, consistent with his episodes today.  He arrives in the ED with his mother.  On arrival, patient will have intermittent waxing and waning responsiveness.  These are described both by patient and mother as typical of his absence seizure's.  They are brief in nature, lasting only few seconds.  He returned to mental baseline afterwards.  Dose of Ativan  was ordered.  Patient is eating doses of AEDs were ordered.  Workup was initiated.  On reassessment, patient's mother reports ongoing brief absence seizure's.  She states that in the past, these have been controlled with Ativan .  Additional Ativan  was ordered.  After this, patient had resolution of seizure activity.  Patient and mother are requesting discharge home.  Patient was discharged in stable condition. I ordered medication including Ativan , Vimpat , Depakote , Xcopri  for seizures; IV fluids for hydration Reevaluation of the patient after these medicines showed that the patient improved I have reviewed the patients home medicines and have made adjustments as needed   Social Determinants of Health:  Has access to outpatient care     Final diagnoses:  Seizures [facility])    ED Discharge Orders     None          Melvenia Motto, MD 11/24/23 2351

## 2023-11-24 NOTE — Discharge Instructions (Addendum)
 Continue home medications as prescribed.  Follow-up with your neurologist.  Return to the emergency department for any new or worsening symptoms of concern.

## 2023-11-24 NOTE — ED Triage Notes (Signed)
 Pt arrives with mother c/o seizures. Per pt he has a hx of absent seizures and has had numerous seizures today. No injuries reported by pt.

## 2023-11-24 NOTE — ED Notes (Signed)
 Mom gave patient a bag of chips to eat. While eating a chip, pt has a focal seizure, dropping his head to the left side. Mom states, see he's having another seizure. This nurse let her know that since he was still having seizures she should probably not be feeding him. He endorsed not having ate all day due to the seizures and I let them know that if they didn't eat at home because of them, that it was still not a good idea to eat here while he was still having them.

## 2023-11-25 LAB — URINE DRUG SCREEN
Amphetamines: NEGATIVE
Barbiturates: NEGATIVE
Benzodiazepines: NEGATIVE
Cocaine: NEGATIVE
Fentanyl: NEGATIVE
Methadone Scn, Ur: NEGATIVE
Opiates: NEGATIVE
Tetrahydrocannabinol: NEGATIVE

## 2023-11-28 LAB — LACOSAMIDE: Lacosamide: 4.4 ug/mL — ABNORMAL LOW (ref 5.0–10.0)

## 2023-12-17 ENCOUNTER — Other Ambulatory Visit: Payer: Self-pay

## 2023-12-17 ENCOUNTER — Emergency Department (HOSPITAL_COMMUNITY)
Admission: EM | Admit: 2023-12-17 | Discharge: 2023-12-17 | Disposition: A | Discharge status: Home / Self Care | Attending: Emergency Medicine | Admitting: Emergency Medicine

## 2023-12-17 ENCOUNTER — Emergency Department (HOSPITAL_COMMUNITY)

## 2023-12-17 DIAGNOSIS — Z7901 Long term (current) use of anticoagulants: Secondary | ICD-10-CM | POA: Diagnosis not present

## 2023-12-17 DIAGNOSIS — R569 Unspecified convulsions: Secondary | ICD-10-CM | POA: Diagnosis present

## 2023-12-17 LAB — VALPROIC ACID LEVEL: Valproic Acid Lvl: 64 ug/mL (ref 50–100)

## 2023-12-17 LAB — COMPREHENSIVE METABOLIC PANEL WITH GFR
ALT: 12 U/L (ref 0–44)
AST: 19 U/L (ref 15–41)
Albumin: 3.5 g/dL (ref 3.5–5.0)
Alkaline Phosphatase: 41 U/L (ref 38–126)
Anion gap: 12 (ref 5–15)
BUN: 23 mg/dL — ABNORMAL HIGH (ref 6–20)
CO2: 26 mmol/L (ref 22–32)
Calcium: 8.6 mg/dL — ABNORMAL LOW (ref 8.9–10.3)
Chloride: 99 mmol/L (ref 98–111)
Creatinine, Ser: 0.66 mg/dL (ref 0.61–1.24)
GFR, Estimated: >60 mL/min (ref >60)
Glucose, Bld: 89 mg/dL (ref 70–99)
Potassium: 3.8 mmol/L (ref 3.5–5.1)
Sodium: 137 mmol/L (ref 135–145)
Total Bilirubin: 0.6 mg/dL (ref 0.0–1.2)
Total Protein: 6.9 g/dL (ref 6.5–8.1)

## 2023-12-17 LAB — CBC WITH DIFFERENTIAL/PLATELET
Abs Immature Granulocytes: 0.01 K/uL (ref 0.00–0.07)
Basophils Absolute: 0 K/uL (ref 0.0–0.1)
Basophils Relative: 1 %
Eosinophils Absolute: 0 K/uL (ref 0.0–0.5)
Eosinophils Relative: 1 %
HCT: 40.1 % (ref 39.0–52.0)
Hemoglobin: 14.2 g/dL (ref 13.0–17.0)
Immature Granulocytes: 0 %
Lymphocytes Relative: 30 %
Lymphs Abs: 1.4 K/uL (ref 0.7–4.0)
MCH: 30.6 pg (ref 26.0–34.0)
MCHC: 35.4 g/dL (ref 30.0–36.0)
MCV: 86.4 fL (ref 80.0–100.0)
Monocytes Absolute: 0.4 K/uL (ref 0.1–1.0)
Monocytes Relative: 8 %
Neutro Abs: 2.7 K/uL (ref 1.7–7.7)
Neutrophils Relative %: 60 %
Platelets: 214 K/uL (ref 150–400)
RBC: 4.64 MIL/uL (ref 4.22–5.81)
RDW: 11.9 % (ref 11.5–15.5)
WBC: 4.6 K/uL (ref 4.0–10.5)
nRBC: 0 % (ref 0.0–0.2)

## 2023-12-17 LAB — ETHANOL: Alcohol, Ethyl (B): <15 mg/dL (ref <15)

## 2023-12-17 MED ORDER — LACOSAMIDE 50 MG PO TABS
200.0000 mg | ORAL_TABLET | Freq: Once | ORAL | Status: AC
  Administered 2023-12-17: 200 mg via ORAL
  Filled 2023-12-17: qty 4

## 2023-12-17 NOTE — Discharge Instructions (Signed)
 Labs and scans today looked fine. Make sure not to miss doses of your medications. Follow-up closely with your neurology team. Return here for new concerns.

## 2023-12-17 NOTE — ED Provider Notes (Signed)
 [place] EMERGENCY DEPARTMENT AT [facility] Provider Note   CSN: [id number] Arrival date & time: 12/17/23  0011     Patient presents with: Seizures   AEDEN MATRANGA is a 23 y.o. male.   The history is provided by the patient and medical records.  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  Patient reported he did not eat today so had not taken any of his seizure medications.  He reports he was walking back from the kitchen when he had all of that he was going to have another seizure.  He was carrying a tray and sat this down but then collapsed into the floor.  Thinks he struck his head on a nearby vase that broke.  Mom was at home and witnessed this.  Had 2 other focal seizures with EMS.  States he recently had his DBS generator replaced and since then has had increased seizure frequency.  He is followed at [facility].  Prior to Admission medications   Medication Sig Start Date End Date Taking? Authorizing Provider  apixaban  (ELIQUIS ) 5 MG TABS tablet Take 1 tablet (5 mg total) by mouth 2 (two) times daily. PLEASE START ONLY AFTER THE STARTER PACK HAS BEEN COMPLETED. Patient not taking: Reported on 11/24/2023 02/29/20   Krishnan, Gokul, MD  APIXABAN  (ELIQUIS ) VTE STARTER PACK (10MG  AND 5MG ) Take as directed on package: start with two-5mg  tablets twice daily for 7 days. On day 8, switch to one-5mg  tablet twice daily. Patient not taking: Reported on 11/24/2023 01/23/20   Verdene Purchase, MD  Cenobamate  200 MG TABS Take 200 mg by mouth at bedtime. Take along with 50 mg tablet=250 mg 01/14/20   [provider]  Cenobamate  50 MG TABS Take 50 mg by mouth at bedtime. Take along with 200 mg tablet=250 mg    [provider]  clonazePAM  (KLONOPIN ) 1 MG tablet Take 2 mg by mouth daily as needed for anxiety  (multiple seizures).    [provider]  divalproex  (DEPAKOTE  ER) 250 MG 24 hr tablet Take 1,000 mg by mouth at bedtime.    [provider]  Midazolam  (NAYZILAM ) 5 MG/0.1ML SOLN Place 1 each into the nose See admin instructions. Use 1 spray nasally as needed for seizures that presist 5 mins after trying clonazepam  09/04/19   [provider]    Allergies: Keppra [levetiracetam] and Penicillins    Review of Systems  Neurological:  Positive for seizures.  All other systems reviewed and are negative.   Updated Vital Signs BP (!) 126/99 (BP Location: Right Arm)   Pulse 84   Temp 98.1 F (36.7 C) (Oral)   Resp 17   Ht 6' 2 (1.88 m)   Wt 70.3 kg   SpO2 99%   BMI 19.90 kg/m   Physical Exam Vitals and nursing note reviewed.  Constitutional:      Appearance: He is well-developed.  HENT:     Head: Normocephalic and atraumatic.     Comments: Hematoma and contusion to right forehead, there is no visible laceration, locally tender Eyes:     Conjunctiva/sclera: Conjunctivae normal.     Pupils: Pupils are equal, round, and reactive to light.  Neck:     Comments: C-collar in place Cardiovascular:     Rate and Rhythm: Normal rate and regular rhythm.     Heart sounds: Normal heart  sounds.  Pulmonary:     Effort: Pulmonary effort is normal.     Breath sounds: Normal breath sounds.  Chest:     Comments: Surgical incision along right chest is clean, dry, and intact, Dermabond present without any erythema or induration Abdominal:     General: Bowel sounds are normal.     Palpations: Abdomen is soft.  Musculoskeletal:        General: Normal range of motion.  Skin:    General: Skin is warm and dry.  Neurological:     Mental Status: He is alert and oriented to person, place, and time.     Comments: AAOx3, answering questions and following commands, moving extremities well without focal deficit     (all labs ordered are listed, but only abnormal results are  displayed) Labs Reviewed  COMPREHENSIVE METABOLIC PANEL WITH GFR - Abnormal; Notable for the following components:      Result Value   BUN 23 (*)    Calcium 8.6 (*)    All other components within normal limits  CBC WITH DIFFERENTIAL/PLATELET  ETHANOL  VALPROIC  ACID LEVEL  RAPID URINE DRUG SCREEN, HOSP PERFORMED    EKG: None  Radiology: CT Head Wo Contrast Result Date: 12/17/2023 EXAM: CT HEAD WITHOUT CONTRAST 12/17/2023 01:07:01 AM TECHNIQUE: CT of the head was performed without the administration of intravenous contrast. Automated exposure control, iterative reconstruction, and/or weight based adjustment of the mA/kV was utilized to reduce the radiation dose to as low as reasonably achievable. COMPARISON: CT head September 02, 2023 CLINICAL HISTORY: Seizure, new-onset, history of trauma. FINDINGS: BRAIN AND VENTRICLES: No acute hemorrhage. No evidence of acute infarct. No hydrocephalus. No extra-axial collection. No mass effect or midline shift. Bilateral deep brain stimulator leads in similar position. ORBITS: No acute abnormality. SINUSES: No acute abnormality. SOFT TISSUES AND SKULL: Right forehead contusion. No skull fracture. IMPRESSION: 1. No acute intracranial abnormality. 2. Right forehead contusion. Electronically signed by: Gilmore Molt MD 12/17/2023 01:34 AM EST RP Workstation: [id number]   CT Cervical Spine Wo Contrast Result Date: 12/17/2023 EXAM: CT HEAD WITHOUT CONTRAST 12/17/2023 01:07:01 AM TECHNIQUE: CT of the head was performed without the administration of intravenous contrast. Automated exposure control, iterative reconstruction, and/or weight based adjustment of the mA/kV was utilized to reduce the radiation dose to as low as reasonably achievable. COMPARISON: CT head September 02, 2023 CLINICAL HISTORY: Seizure, new-onset, history of trauma. FINDINGS: BRAIN AND VENTRICLES: No acute hemorrhage. No evidence of acute infarct. No hydrocephalus. No extra-axial collection. No mass  effect or midline shift. Bilateral deep brain stimulator leads in similar position. ORBITS: No acute abnormality. SINUSES: No acute abnormality. SOFT TISSUES AND SKULL: Right forehead contusion. No skull fracture. IMPRESSION: 1. No acute intracranial abnormality. 2. Right forehead contusion. Electronically signed by: Gilmore Molt MD 12/17/2023 01:34 AM EST RP Workstation: [id number]     Procedures   Medications Ordered in the ED  lacosamide  (VIMPAT ) tablet 200 mg (200 mg Oral Given 12/17/23 0049)                                    Medical Decision Making Amount and/or Complexity of Data Reviewed Labs: ordered. Radiology: ordered and independent interpretation performed. ECG/medicine tests: ordered and independent interpretation performed.  Risk Prescription drug management.   23 year old male presenting to the ED after seizure at home.  Witnessed by mother.  Does have evidence of head injury.  C-collar in place.  He is awake, alert, oriented on arrival.  Has no focal neurologic deficits.  Does have DBS in place.  New generator placed recently, site appears clean and dermabond remains intact.  Will obtain CT head/neck given trauma, screening labs.  Did not take his meds today, ordered here.  Xcopri  not on formulary, does not have meds with him currently.  CT head/neck without acute findings, forehead hematoma.  Labs grossly reassuring without leukocytosis or electrolyte derangement.  His valproic  acid level is within normal limits.  Patient has not had any further seizure activity here.  C-collar was removed and he is ranging his neck without difficulty.  Remains hemodynamically stable.  Overall, suspect his seizure is due to medication noncompliance.  Stressed importance of not missing doses and taking at proper timing.  He will need to follow-up closely with his neurologist.  Can return here for new concerns.  Final diagnoses:  Seizure [facility])    ED Discharge Orders     None           Jarold Olam HERO, PA-C 12/17/23 0334    Trine Raynell Moder, MD 12/17/23 2356

## 2023-12-17 NOTE — ED Triage Notes (Signed)
 Pt BIB EMS from home for seizure, pt was standing and hit head on hardwood floor, hematoma present to R forehead. EMS reports approx. 20 seizures in last 4 days and weight loss over last few months. EMS rep[orts pt had 2 absent seizures pta, reports arms clinch up lasting roughly 30 secs each. Alert following episode.   No meds admin by EMS. EMS VS 138/90, HR 86, 99% 2L, 96 cbg

## 2023-12-17 NOTE — ED Notes (Signed)
 Pt wheelchaired to lobby by RN to wait for [employer] to arrive to take him home upon dc, VSS, A&Ox4.
